# Patient Record
Sex: Male | Born: 1945
Health system: Southern US, Community
[De-identification: ages and names within clinical notes are randomized; demographics above are authoritative.]

## PROBLEM LIST (undated history)

## (undated) DIAGNOSIS — F1910 Other psychoactive substance abuse, uncomplicated: Secondary | ICD-10-CM

## (undated) DIAGNOSIS — J449 Chronic obstructive pulmonary disease, unspecified: Secondary | ICD-10-CM

## (undated) DIAGNOSIS — Z87442 Personal history of urinary calculi: Secondary | ICD-10-CM

## (undated) DIAGNOSIS — N200 Calculus of kidney: Secondary | ICD-10-CM

## (undated) DIAGNOSIS — R768 Other specified abnormal immunological findings in serum: Secondary | ICD-10-CM

## (undated) DIAGNOSIS — R7611 Nonspecific reaction to tuberculin skin test without active tuberculosis: Secondary | ICD-10-CM

## (undated) DIAGNOSIS — M1990 Unspecified osteoarthritis, unspecified site: Secondary | ICD-10-CM

## (undated) DIAGNOSIS — I10 Essential (primary) hypertension: Secondary | ICD-10-CM

## (undated) DIAGNOSIS — K759 Inflammatory liver disease, unspecified: Secondary | ICD-10-CM

## (undated) DIAGNOSIS — M5430 Sciatica, unspecified side: Secondary | ICD-10-CM

## (undated) HISTORY — DX: Other psychoactive substance abuse, uncomplicated: F19.10

## (undated) HISTORY — DX: Unspecified osteoarthritis, unspecified site: M19.90

## (undated) HISTORY — DX: Chronic obstructive pulmonary disease, unspecified: J44.9

## (undated) HISTORY — DX: Sciatica, unspecified side: M54.30

## (undated) HISTORY — PX: OTHER SURGICAL HISTORY: SHX169

## (undated) HISTORY — DX: Calculus of kidney: N20.0

## (undated) HISTORY — PX: ORTHOPEDIC SURGERY: SHX850

## (undated) HISTORY — PX: TONSILLECTOMY: SUR1361

---

## 2004-03-10 ENCOUNTER — Emergency Department (HOSPITAL_COMMUNITY): Admission: EM | Admit: 2004-03-10 | Discharge: 2004-03-10 | Payer: Self-pay | Admitting: Emergency Medicine

## 2011-07-06 ENCOUNTER — Emergency Department (INDEPENDENT_AMBULATORY_CARE_PROVIDER_SITE_OTHER)
Admission: EM | Admit: 2011-07-06 | Discharge: 2011-07-06 | Disposition: A | Discharge status: Home / Self Care | Payer: Self-pay | Source: Home / Self Care

## 2011-07-06 ENCOUNTER — Encounter: Payer: Self-pay | Admitting: *Deleted

## 2011-07-06 DIAGNOSIS — J45909 Unspecified asthma, uncomplicated: Secondary | ICD-10-CM

## 2011-07-06 DIAGNOSIS — I10 Essential (primary) hypertension: Secondary | ICD-10-CM

## 2011-07-06 HISTORY — DX: Other specified abnormal immunological findings in serum: R76.8

## 2011-07-06 HISTORY — DX: Essential (primary) hypertension: I10

## 2011-07-06 MED ORDER — LISINOPRIL-HYDROCHLOROTHIAZIDE 20-12.5 MG PO TABS: 1.0000 | ORAL_TABLET | Freq: Every day | ORAL | Status: DC

## 2011-07-06 MED ORDER — ALBUTEROL SULFATE HFA 108 (90 BASE) MCG/ACT IN AERS
INHALATION_SPRAY | RESPIRATORY_TRACT | Status: AC
  Filled 2011-07-06: qty 6.7

## 2011-07-06 MED ORDER — ALBUTEROL SULFATE HFA 108 (90 BASE) MCG/ACT IN AERS
2.0000 | INHALATION_SPRAY | RESPIRATORY_TRACT | Status: DC | PRN
  Administered 2011-07-06: 2 via RESPIRATORY_TRACT

## 2011-07-06 NOTE — ED Provider Notes (Signed)
History     CSN: [id number] Arrival date & time: 07/06/2011  3:40 PM   None     Chief Complaint  Patient presents with  . Medication Refill  . Requesting labs be done     (Consider location/radiation/quality/duration/timing/severity/associated sxs/prior treatment) HPI Comments: Pt presents requesting a refill of his BP medication and asthma inhaler. He was incarcerated and released from prison one month ago. His BP has been well controlled on his current BP medication. No chest pain or pressure. Also has a hx of asthma - well controlled. He states recently he has had some dyspnea in the evenings and is relieved with albuterol use. He is walking during the day without dyspnea or chest discomfort. No peripheral edema or orthopnea. He also would like to have PSA, Hep C and routine labs done. He had a positive Hep C test 4 yrs ago but was not followed up and was reportedly told by one of the physicians while incarcerated to have the blood test repeated in case it was a false positive. He also had an elevated PSA test while incarcerated but reports his levels then decreased. No prostate [place] or biopsy was done.   He has information to apply for his orange card and is working with someone for assistance in obtaining his orange card and a PCP - he has an appt on Monday regarding this.  Patient is a 65 y.o. male presenting with hypertension. The history is provided by the patient.  Hypertension This is a chronic problem. Episode onset: few yrs ago. The problem occurs constantly. The problem has not changed since onset.Associated symptoms include shortness of breath. Pertinent negatives include no chest pain, no abdominal pain and no headaches. The symptoms are aggravated by nothing. The symptoms are relieved by medications. Treatments tried: BP medication. The treatment provided significant relief.    Past Medical History  Diagnosis Date  . Asthma   . Hypertension   . Hepatitis C antibody test  positive     Past Surgical History  Procedure Date  . Orthopedic surgery     LEFT wrist in 1989    Family History  Problem Relation Age of Onset  . Cancer Other   . Heart disease Other     History  Substance Use Topics  . Smoking status: Former Smoker    Types: Cigarettes  . Smokeless tobacco: Not on file  . Alcohol Use: Yes     Social Use      Review of Systems  Constitutional: Negative for fever and chills.  Respiratory: Positive for cough and shortness of breath. Negative for wheezing.   Cardiovascular: Negative for chest pain, palpitations and leg swelling.  Gastrointestinal: Negative for nausea, vomiting and abdominal pain.  Neurological: Negative for headaches.    Allergies  Aspirin and Penicillins  Home Medications   Current Outpatient Rx  Name Route Sig Dispense Refill  . ALBUTEROL SULFATE HFA 108 (90 BASE) MCG/ACT IN AERS Inhalation Inhale 2 puffs into the lungs every 6 (six) hours as needed.      Marland Kitchen LISINOPRIL-HYDROCHLOROTHIAZIDE 20-12.5 MG PO TABS Oral Take 1 tablet by mouth daily. 30 tablet 0    BP 130/73  Pulse 60  Temp(Src) 97.8 F (36.6 C) (Oral)  Resp 20  SpO2 99%  Physical Exam  Nursing note and vitals reviewed. Constitutional: He appears well-developed and well-nourished. No distress.  HENT:  Head: Normocephalic and atraumatic.  Right Ear: Tympanic membrane, external ear and ear canal normal.  Left Ear: Tympanic  membrane, external ear and ear canal normal.  Nose: Nose normal.  Mouth/Throat: Uvula is midline, oropharynx is clear and moist and mucous membranes are normal. No oropharyngeal exudate, posterior oropharyngeal edema or posterior oropharyngeal erythema.  Eyes: Lids are normal. No scleral icterus.  Neck: Neck supple.  Cardiovascular: Normal rate, regular rhythm and normal heart sounds.   Pulmonary/Chest: Effort normal and breath sounds normal. No respiratory distress.  Musculoskeletal: He exhibits no edema.  Lymphadenopathy:     He has no cervical adenopathy.  Neurological: He is alert.  Skin: Skin is warm and dry.  Psychiatric: He has a normal mood and affect.    ED Course  Procedures (including critical care time)  Labs Reviewed - No data to display No results found.   1. Hypertension   2. Asthma       MDM  Discussed with pt that he will need to obtain a PCP for follow up of his chronic health concerns, including follow up of Hep C and PSA.       Melody Comas, PA 07/06/11 1700

## 2011-07-06 NOTE — ED Provider Notes (Signed)
Medical screening examination/treatment/procedure(s) were performed by non-physician practitioner and as supervising physician I was immediately available for consultation/collaboration.  Luiz Blare MD   Luiz Blare, MD 07/06/11 2229

## 2011-07-06 NOTE — ED Notes (Signed)
Pt here requesting medication refill for Lisinopril-HCTZ & Albuterol; blood draw for PSA & Hep C. Pt recently out of incarceration; has no PCP.

## 2011-09-03 ENCOUNTER — Encounter: Payer: Self-pay | Admitting: Family Medicine

## 2011-09-03 ENCOUNTER — Ambulatory Visit (INDEPENDENT_AMBULATORY_CARE_PROVIDER_SITE_OTHER): Payer: Self-pay | Admitting: Family Medicine

## 2011-09-03 DIAGNOSIS — R894 Abnormal immunological findings in specimens from other organs, systems and tissues: Secondary | ICD-10-CM

## 2011-09-03 DIAGNOSIS — M79609 Pain in unspecified limb: Secondary | ICD-10-CM

## 2011-09-03 DIAGNOSIS — R768 Other specified abnormal immunological findings in serum: Secondary | ICD-10-CM

## 2011-09-03 DIAGNOSIS — G8929 Other chronic pain: Secondary | ICD-10-CM

## 2011-09-03 DIAGNOSIS — M25569 Pain in unspecified knee: Secondary | ICD-10-CM

## 2011-09-03 DIAGNOSIS — R972 Elevated prostate specific antigen [PSA]: Secondary | ICD-10-CM | POA: Insufficient documentation

## 2011-09-03 DIAGNOSIS — N486 Induration penis plastica: Secondary | ICD-10-CM | POA: Insufficient documentation

## 2011-09-03 DIAGNOSIS — M79641 Pain in right hand: Secondary | ICD-10-CM

## 2011-09-03 DIAGNOSIS — L821 Other seborrheic keratosis: Secondary | ICD-10-CM

## 2011-09-03 DIAGNOSIS — R7611 Nonspecific reaction to tuberculin skin test without active tuberculosis: Secondary | ICD-10-CM

## 2011-09-03 DIAGNOSIS — I10 Essential (primary) hypertension: Secondary | ICD-10-CM

## 2011-09-03 DIAGNOSIS — K625 Hemorrhage of anus and rectum: Secondary | ICD-10-CM

## 2011-09-03 DIAGNOSIS — Z299 Encounter for prophylactic measures, unspecified: Secondary | ICD-10-CM

## 2011-09-03 LAB — HEPATITIS C ANTIBODY: HCV Ab: REACTIVE — AB

## 2011-09-03 LAB — COMPREHENSIVE METABOLIC PANEL
ALT: 24 U/L (ref 0–53)
AST: 26 U/L (ref 0–37)
Albumin: 4 g/dL (ref 3.5–5.2)
CO2: 26 mEq/L (ref 19–32)
Calcium: 9.2 mg/dL (ref 8.4–10.5)
Total Protein: 6.6 g/dL (ref 6.0–8.3)

## 2011-09-03 MED ORDER — LISINOPRIL-HYDROCHLOROTHIAZIDE 20-12.5 MG PO TABS: 1.0000 | ORAL_TABLET | Freq: Every day | ORAL | Status: DC

## 2011-09-03 NOTE — Patient Instructions (Signed)
It was great to meet you today. We will do the referrals for urology. We will make an appt for the procedure clinic at the [facility] for the moles on your back. You do not have tuberculosis - your symptom review is negative.  If your housing situation requires a chest x ray, then please call us and we will order that.   Please come back and see me in 1 month.  We will work on your wrist pain and knee pain at that time.

## 2011-09-03 NOTE — Assessment & Plan Note (Signed)
Will check hep C antibody test today, check cmp.

## 2011-09-03 NOTE — Assessment & Plan Note (Signed)
Pt at goal.  Refill meds today.  Check CMP.

## 2011-09-03 NOTE — Assessment & Plan Note (Signed)
Discussed at length the ramifications of checking PSA.  Pt prefers to go ahead with testing today.  Declines rectal exam.

## 2011-09-03 NOTE — Progress Notes (Signed)
Subjective:     Patient ID: Brian Morgan, male   DOB: 05/23/1946, 66 y.o.   MRN: [id number]  HPI 66 yo new pt here to establish care.  Has several issues: 1) Would like a PSA.  Has had PSA tests in the past, with highest being 5.9.  Last check was 3.15 April 2011.  Denies any trouble with initiating stream, incomplete voiding, other urinary sx except for frequency since starting his HCTZ.  Pt does not want a DRE today. 2) Has lots of moles on his back.  These scare his partner, and she would like to have them checked.  He has several that are large, and on his L side that he keeps catching with his arm. 3) Wants a CXR.  Has h/o positive PPD - took INH for 1 year in the 74s.  Denies cough, hemoptysis, weight loss and night sweats.  Thinks he needs the CXR for his living situation. 4) Would like a referral to urologist for Peyroni's disease.  Has been diagnosed with this about 8 months ago.  He has had no treatment other than observation.  When he has an erection, he notes a hard spot on his penis and curvature of the penis.  He has attempted intercourse, but is unsuccessful because "things don't fit".   5)Was told he had hep c when he tried to give blood years ago.  Would like another hep C test, because he was told it could be a false positive result.  He feels ok because all his tests since then (blood work) have been fine.  +h/o IVDU.  + tattoos 6)Would like a referral to have his partial plate on top fixed.  Currently glued together with super glue. 7) Has knee pain on L.  Was seen in past by ortho as part of voc rehab.  States at the time that he was told he should get 3 shots, but was unable to get that done because it was just for evaluation.  Pain present for many years.  Walks a lot as part of his day to day routine.   8) Carpal tunnel syndrome R wrist.  Has had pain in that hand. Received cortisone shots in 2000 and 2001.   9)Hemorrhoids- never had a colonoscopy.  Reports he has smears of  blood with bowel movements sometimes.  +itching.  Regular BM.  No constipation. 10)Needs BP pills refilled.  Has one left.  Has been stable on his current dose.  No SOB or CP.   Review of SystemsSee HPI.  Also with some sinus congestion "dripping".  No nausea or vomiting.     Objective:   Physical Exam  Constitutional: He appears well-developed and well-nourished. No distress.  HENT:  Head: Normocephalic and atraumatic.  Left Ear: External ear normal.  Nose: Nose normal.  Mouth/Throat: Oropharynx is clear and moist. No oropharyngeal exudate.       Plate on top is cracked and repaired.   R TM obscured by cerumen.  Eyes: Conjunctivae and EOM are normal. Pupils are equal, round, and reactive to light. Right eye exhibits no discharge. Left eye exhibits no discharge. No scleral icterus.  Neck: Normal range of motion. Neck supple. No tracheal deviation present. No thyromegaly present.  Cardiovascular: Normal rate, regular rhythm and normal heart sounds.  Exam reveals no gallop and no friction rub.   No murmur heard. Pulmonary/Chest: Effort normal and breath sounds normal. No stridor. No respiratory distress. He has no wheezes. He has no rales.  Abdominal: Soft. Bowel sounds are normal. He exhibits no distension and no mass. There is no tenderness. There is no rebound and no guarding.  Genitourinary:       Pt declined rectal exam.  GU exam deferred as pt wants urology referral.    Lymphadenopathy:    He has no cervical adenopathy.  Skin: Skin is warm and dry. He is not diaphoretic. No erythema.       Multiple seborrheic keratoses on trunk  Psychiatric:       Nl grooming and dress.  Affect normal.  Thought content and process normal, however, does make jokes to partner about using her scarf to strangle her.  No FOI, No LOA.  Non labile.        Assessment:     See problem list    Plan:        See problem list.

## 2011-09-03 NOTE — Assessment & Plan Note (Signed)
Pt definitely wants referral to urology.  Exam deferred today.

## 2011-09-04 DIAGNOSIS — M79641 Pain in right hand: Secondary | ICD-10-CM | POA: Insufficient documentation

## 2011-09-04 DIAGNOSIS — L821 Other seborrheic keratosis: Secondary | ICD-10-CM | POA: Insufficient documentation

## 2011-09-04 DIAGNOSIS — M25562 Pain in left knee: Secondary | ICD-10-CM | POA: Insufficient documentation

## 2011-09-04 DIAGNOSIS — Z299 Encounter for prophylactic measures, unspecified: Secondary | ICD-10-CM | POA: Insufficient documentation

## 2011-09-04 DIAGNOSIS — K625 Hemorrhage of anus and rectum: Secondary | ICD-10-CM | POA: Insufficient documentation

## 2011-09-04 DIAGNOSIS — R7611 Nonspecific reaction to tuberculin skin test without active tuberculosis: Secondary | ICD-10-CM | POA: Insufficient documentation

## 2011-09-04 NOTE — Assessment & Plan Note (Signed)
Patient reports bright red blood per rectum. He denies ever having a colonoscopy. He does report a history of hemorrhoids. However given the symptom a colonoscopy would be warranted, but finances we'll make this difficult. We will need to refer to [facility] for colonoscopy.

## 2011-09-04 NOTE — Assessment & Plan Note (Signed)
Symptom review negative today no need for chest x-ray however if his housing requires a chest x-ray I will be happy to order one.

## 2011-09-04 NOTE — Assessment & Plan Note (Signed)
Patient would like to see Dr. Denny Levy in  sports medicine clinic because his ex-wife has seen her as well. Notes reviewed from Saundra Shelling assessment. It appears the patient has DJD in his left knee. Will defer treatment to sports medicine clinic.

## 2011-09-04 NOTE — Assessment & Plan Note (Signed)
Patient with multiple seborrheic keratoses on his back and chest. No lesions concerning for malignancy. Will refer to procedure clinic for removal of the ones that are bothering him.

## 2011-09-04 NOTE — Assessment & Plan Note (Signed)
Deferred today due to time constraints will address next visit.

## 2011-09-04 NOTE — Progress Notes (Signed)
Addended by: [place], SARAH T on: 09/04/2011 09:55 AM   Modules accepted: Orders

## 2011-09-05 ENCOUNTER — Encounter: Payer: Self-pay | Admitting: Family Medicine

## 2011-09-05 ENCOUNTER — Ambulatory Visit (INDEPENDENT_AMBULATORY_CARE_PROVIDER_SITE_OTHER): Payer: Self-pay | Admitting: Family Medicine

## 2011-09-05 VITALS — BP 146/81 | HR 60 | Ht 72.0 in | Wt 176.0 lb

## 2011-09-05 DIAGNOSIS — M1990 Unspecified osteoarthritis, unspecified site: Secondary | ICD-10-CM | POA: Insufficient documentation

## 2011-09-05 MED ORDER — METHYLPREDNISOLONE ACETATE 80 MG/ML IJ SUSP: 80.0000 mg | Freq: Once | INTRAMUSCULAR | Status: DC

## 2011-09-05 NOTE — Assessment & Plan Note (Signed)
We discussed stages of treatment to include bracing which is has a custom hinge brace (but it is in storage).  We started with a steroid injection today.  I encouraged him to get his brace out of storage.  At the moment he is wearing a knee sleeve which he says helps.  If the steroid injection gives him relief we will plan on repeating PRN. He is allergic to ASA so he cannot take NSAIDs.  If he doesn't get relief with the steroid injection we will discuss viscous supplementation.  He is not planning on applying for medicare until age 66 so TKR is not an option at this time.

## 2011-09-05 NOTE — Patient Instructions (Addendum)
Your knee pain is from severe arthritis.  Today we did an injection.  Today we did a steroid injection into the knee.  This should also help with inflammation and pain.  You may have some crampy pain of the knee tonight, just put ice on it.  This is a common thing to happen on the first night.  Your knee pain should improve over the next few days.  If you continue to have pain you can try taking Tylenol.   We also discussed your carpal tunnel syndrome.  We will plan to do an injection for that on your follow up visit.  In the mean time I recommend that you wear your wrist brace and avoid activities that worsen your symptoms.  Please follow up with me in 3-4 weeks.  The phone number to the office is [phone].  Glo Herring, MD

## 2011-09-05 NOTE — Progress Notes (Signed)
  Subjective:    Patient ID: Brian Morgan, male    DOB: [birth year], 66 y.o.   MRN: [id number]  HPI 66 y/o male is here for chronic knee pain secondary severe degenerative arthritis.  He has a history of being stepped on by a horse in 1984.  He was evaluated by Dr. Dion Saucier at [facility] in December 2012 and diagnosed with end stage DJD.  The evaluation indicated that he would eventually need a TKR.  He didn't get any treatment at the time of the evaluation because he couldn't afford to pay for injection therapy.  He is here today after having obtained an orange card.   He also has a history of right carpal tunnel syndrome dating back to 1995.  Back in 2001 he got a steroid injection and it improved his symptoms for over one year.  He wears a brace now at night and sometimes in the day but is still bothered by symptoms.  He is wondering if he can get an injection.   Review of Systems     Objective:   Physical Exam  No effusion of either knee Both knees have crepitus, left greater than right ROM is 0-110 He has pain with flexion of the knee There is instability with valgus stress Positive anterior drawer   The right wrist has no swelling No tenderness to palpation and a normal range of motion Positive Tinel Thenar imminence bulk is preserved Sensation is normal in the fingers and hand Strength is normal in the fingers and hand   Consent obtained and verified. Cleansed with alcohol. Topical analgesic spray: Ethyl chloride. Joint: left knee Approached in typical fashion with: Completed without difficulty Meds:80 mg depo medrol; 4ml of lidocaine without epinephrine Needle: 25 g Aftercare instructions and Red flags advised.      Assessment & Plan:

## 2011-09-06 ENCOUNTER — Other Ambulatory Visit: Payer: Self-pay | Admitting: Family Medicine

## 2011-09-06 ENCOUNTER — Telehealth: Payer: Self-pay | Admitting: Family Medicine

## 2011-09-06 DIAGNOSIS — R768 Other specified abnormal immunological findings in serum: Secondary | ICD-10-CM

## 2011-09-06 NOTE — Telephone Encounter (Signed)
Called pt to discuss his hep c results.  Left message to call me.

## 2011-09-07 ENCOUNTER — Other Ambulatory Visit: Payer: Self-pay

## 2011-09-07 DIAGNOSIS — R768 Other specified abnormal immunological findings in serum: Secondary | ICD-10-CM

## 2011-09-12 LAB — HEPATITIS C RNA QUANTITATIVE: HCV Quantitative Log: 6.14 {Log} — ABNORMAL HIGH (ref <1.63)

## 2011-09-17 ENCOUNTER — Other Ambulatory Visit: Payer: Self-pay | Admitting: Family Medicine

## 2011-09-17 DIAGNOSIS — B1920 Unspecified viral hepatitis C without hepatic coma: Secondary | ICD-10-CM

## 2011-09-26 ENCOUNTER — Encounter: Payer: Self-pay | Admitting: Family Medicine

## 2011-09-26 ENCOUNTER — Ambulatory Visit (INDEPENDENT_AMBULATORY_CARE_PROVIDER_SITE_OTHER): Payer: Self-pay | Admitting: Family Medicine

## 2011-09-26 VITALS — BP 147/85 | HR 61 | Ht 72.0 in | Wt 185.0 lb

## 2011-09-26 DIAGNOSIS — G5603 Carpal tunnel syndrome, bilateral upper limbs: Secondary | ICD-10-CM

## 2011-09-26 DIAGNOSIS — G5600 Carpal tunnel syndrome, unspecified upper limb: Secondary | ICD-10-CM

## 2011-09-26 NOTE — Patient Instructions (Signed)
We injected your hand today for carpal tunnel.  Follow up with me if your hands or knee starts to bother you again.

## 2011-09-28 ENCOUNTER — Other Ambulatory Visit: Payer: Self-pay | Admitting: Family Medicine

## 2011-09-28 ENCOUNTER — Telehealth: Payer: Self-pay | Admitting: Family Medicine

## 2011-09-28 DIAGNOSIS — R7611 Nonspecific reaction to tuberculin skin test without active tuberculosis: Secondary | ICD-10-CM

## 2011-09-28 NOTE — Telephone Encounter (Signed)
Dr. Swaziland, Can you please put in chest x-ray order ----[place] Dec

## 2011-09-28 NOTE — Telephone Encounter (Signed)
Pt is needing to get a chest xray due to him not being able to get a PPD.  The facility he is staying at is requiring this. [facility] P. [phone] F. [phone]

## 2011-10-01 ENCOUNTER — Telehealth: Payer: Self-pay | Admitting: *Deleted

## 2011-10-01 ENCOUNTER — Ambulatory Visit (HOSPITAL_COMMUNITY)
Admission: RE | Admit: 2011-10-01 | Discharge: 2011-10-01 | Disposition: A | Discharge status: Home / Self Care | Payer: Self-pay | Source: Ambulatory Visit | Attending: Family Medicine | Admitting: Family Medicine

## 2011-10-01 ENCOUNTER — Telehealth: Payer: Self-pay | Admitting: Family Medicine

## 2011-10-01 DIAGNOSIS — R7611 Nonspecific reaction to tuberculin skin test without active tuberculosis: Secondary | ICD-10-CM | POA: Insufficient documentation

## 2011-10-01 NOTE — Telephone Encounter (Signed)
[facility] form-Consult sheet for chest x-ray placed in Dr. Elvis Coil box for signature.  Ileana Ladd

## 2011-10-01 NOTE — Telephone Encounter (Signed)
Patient dropped off form to be filled out for confirmation of xray.  Please fax when completed.

## 2011-10-01 NOTE — Telephone Encounter (Signed)
Called and informed pt that Dr. Swaziland would like for him to have a CXR done and I told pt that he can go over to [facility] 1st floor radiology to have this done and his convenience. Pt understood and agreed.Laureen Ochs, Viann Shove

## 2011-10-02 NOTE — Telephone Encounter (Signed)
There [facility] form completed and faxed with copy of chest x-ray to [phone].  Ileana Ladd

## 2011-10-03 DIAGNOSIS — G5603 Carpal tunnel syndrome, bilateral upper limbs: Secondary | ICD-10-CM | POA: Insufficient documentation

## 2011-10-03 MED ORDER — METHYLPREDNISOLONE ACETATE 40 MG/ML IJ SUSP: 40.0000 mg | Freq: Once | INTRAMUSCULAR | Status: DC

## 2011-10-03 NOTE — Assessment & Plan Note (Signed)
Injected right carpal tunnel.  Follow up PRN

## 2011-10-03 NOTE — Progress Notes (Signed)
  Subjective:    Patient ID: Brian Morgan, male    DOB: [birth year], 66 y.o.   MRN: [id number]  HPI 66 y/o male is here with known right carpal tunnel.  He had an injection in the past that helped and he is here today for another injection.   Review of Systems     Objective:   Physical Exam  Right hand: Sensation is intact Normal motion at the hand and the wrist Thenar imminence unremarkable positive tinel's   Consent obtained and verified. Cleansed with alcohol. Topical analgesic spray: Ethyl chloride. Joint: carpal tunnel Approached in typical fashion Completed without difficulty Meds: 40mg  depo medrol; 1 ml of lidocaine without epinephrine Needle: 25g Aftercare instructions and Red flags advised.      Assessment & Plan:

## 2011-10-08 ENCOUNTER — Ambulatory Visit (INDEPENDENT_AMBULATORY_CARE_PROVIDER_SITE_OTHER): Payer: Self-pay | Admitting: Family Medicine

## 2011-10-08 VITALS — BP 122/72 | HR 74 | Temp 97.6°F | Ht 72.0 in | Wt 174.0 lb

## 2011-10-08 DIAGNOSIS — K625 Hemorrhage of anus and rectum: Secondary | ICD-10-CM

## 2011-10-08 DIAGNOSIS — L821 Other seborrheic keratosis: Secondary | ICD-10-CM

## 2011-10-08 DIAGNOSIS — N486 Induration penis plastica: Secondary | ICD-10-CM

## 2011-10-08 DIAGNOSIS — I10 Essential (primary) hypertension: Secondary | ICD-10-CM

## 2011-10-08 DIAGNOSIS — B1920 Unspecified viral hepatitis C without hepatic coma: Secondary | ICD-10-CM | POA: Insufficient documentation

## 2011-10-08 DIAGNOSIS — Z23 Encounter for immunization: Secondary | ICD-10-CM

## 2011-10-08 NOTE — Assessment & Plan Note (Signed)
Will schedule in procedure clinic for removal.

## 2011-10-08 NOTE — Assessment & Plan Note (Signed)
Pt has been referrred to Hep C clinic.  He is not interested in treatment at this time, but agrees to go and discuss options.

## 2011-10-08 NOTE — Patient Instructions (Signed)
IT was good to see you today. We will check into the referrals for urology. Please let us know if you want to see the Hep C doctors, or if you can get to [facility] for a colonoscopy. We will set you up in the procedure clinic for the moles.

## 2011-10-08 NOTE — Assessment & Plan Note (Signed)
Still awaiting urology.  Considering going to [facility].

## 2011-10-08 NOTE — Assessment & Plan Note (Signed)
Will refer to [facility] for colonoscopy.  Pt with limited financial resources, but interested in an appt at [facility].

## 2011-10-08 NOTE — Progress Notes (Signed)
  Subjective:    Patient ID: Brian Morgan, male    DOB: 07-Sep-1945, 66 y.o.   MRN: [id number]  HPI 65yo to follow up on Hep C, urology referrals and mole removal. He has not heard from the urology referral, and would very much like to hear from them. Hep C- He does not want any treatment, has a friend currently taking Interferon and does not want that.  He feels fine, and reports his labs have always been fine.  Agrees to get Hep A nd B vaccines.  Knows to avoid alcohol and acetaminophen.  Agrees to go to Hep C clinic and hear options. SK- was to be scheduled for procedure clinic for this. Rectal bleeding - unable to afford colonoscopy.  Having difficulties getting to [facility], but thinks it might be possible.  Long h/o hemorrhoids, notes bleeding when he wipes.  Uses witch hazel wipes and hemorrhoid ointment. Has never had colonoscopy. Seen by sports medicine clinic.  States wrist injection helped somewhat, but still has pain.  Knee injection did not help.      Review of Systems See hpi     Objective:   Physical Exam  Constitutional: He appears well-developed and well-nourished. No distress.  Skin: He is not diaphoretic.          Assessment & Plan:

## 2011-10-08 NOTE — Assessment & Plan Note (Signed)
Repeat BP normal.  Continue current regimen.

## 2011-10-11 ENCOUNTER — Telehealth: Payer: Self-pay | Admitting: Family Medicine

## 2011-10-11 NOTE — Telephone Encounter (Signed)
Is asking about referrals Urology & GI to [facility]  Best number - [phone]

## 2011-10-18 NOTE — Telephone Encounter (Signed)
GI referral is the referral that was made to hepatitis clinic. GI referred stated he needed to go to that. Appointment is for 02/21/2012 @ 11:30 am

## 2011-10-18 NOTE — Telephone Encounter (Signed)
Brian Morgan is working on.

## 2011-10-22 NOTE — Telephone Encounter (Signed)
Patient is calling about the different referrals that Dr. Swaziland set in place.  He hasn't heard anything about the one for removing moles, Urology and he was told about the one for GI in July, but he needs to know where it is at.

## 2011-10-24 NOTE — Telephone Encounter (Signed)
Pt stll waiting for information about appts with GI and Urology practices.  Please call him as soon as appts made.

## 2011-10-25 NOTE — Telephone Encounter (Signed)
Mole removal set up for 4/4 @ 11am with colpo clinic. Spoke with Dr. Jennette Kettle about setting this up and go OK to set appointment

## 2011-10-25 NOTE — Telephone Encounter (Signed)
Informed patient of that GI appointment at the Hep C clinic. Patient had stated that he was to be seen at the procedure clinic to have the moles removed. Will check on how and when we will schedule this appointment for him.

## 2011-11-08 ENCOUNTER — Ambulatory Visit (INDEPENDENT_AMBULATORY_CARE_PROVIDER_SITE_OTHER): Payer: Self-pay | Admitting: Family Medicine

## 2011-11-08 VITALS — BP 110/72 | HR 62 | Temp 97.8°F | Ht 72.0 in | Wt 169.0 lb

## 2011-11-08 DIAGNOSIS — L821 Other seborrheic keratosis: Secondary | ICD-10-CM

## 2011-11-08 NOTE — Patient Instructions (Signed)
Keep those areas where we removed the lesions clean.  Use antibiotic ointment and keep covered for the next 1-2 days.   If you develop signs of infection (fever/chills, redness, tenderness) in that area, please make an appointment to have those areas evaluated.

## 2011-11-08 NOTE — Progress Notes (Signed)
Patient ID: Brian Morgan, male   DOB: [birth year], 66 y.o.   MRN: [id number] Patient is here for evaluation and management of multiple lesions on his torso. He has had these many years. They are enlarging. Several of them are quite irritated by his clothing etc. He has previously had some of them taken off and was told they were benign. SUBJECTIVE: Multiple hyperpigmented thickened skin lesions consistent with seborrheic keratoses. 3 of these are irritated looking to under the left arm area and one under the right arm area. All of these are oblong and between one half and 1 cm in PROCEDURE note: Patient gives informed consent signed copy in the chart. Area cleaned with Betadine and alcohol and local anesthesia was obtained with 2% lidocaine with epinephrine. The seborrheic keratoses were curetted off. There is minimal bleeding. We applied Band-Aids. He will follow up with his regular provider. He has many other seborrheic keratoses on his torso. Unless they become irritated or inflamed at all because he may need to remove them. They're very classic seborrheic keratosis and I don't think any of them at this time the biopsy.

## 2011-11-23 ENCOUNTER — Other Ambulatory Visit: Payer: Self-pay | Admitting: *Deleted

## 2011-12-03 ENCOUNTER — Encounter: Payer: Self-pay | Admitting: Family Medicine

## 2011-12-06 ENCOUNTER — Ambulatory Visit (INDEPENDENT_AMBULATORY_CARE_PROVIDER_SITE_OTHER): Payer: Self-pay | Admitting: Family Medicine

## 2011-12-06 ENCOUNTER — Encounter: Payer: Self-pay | Admitting: Family Medicine

## 2011-12-06 DIAGNOSIS — G8929 Other chronic pain: Secondary | ICD-10-CM

## 2011-12-06 DIAGNOSIS — M25569 Pain in unspecified knee: Secondary | ICD-10-CM

## 2011-12-06 NOTE — Assessment & Plan Note (Signed)
After verbal and written consent given pt was prepped with betadine.  1:3 kenalog 40 to lidocaine used in left knee.  Pt minimal bleeding dressed with band aid, Pt given red flags to look for pt had better pain control immediatly.   Encourage him to followup with [facility] when he has funds available. Followup with private care physician as needed.

## 2011-12-06 NOTE — Progress Notes (Signed)
  Subjective:    Patient ID: Brian Morgan, male    DOB: [birth year], 66 y.o.   MRN: [id number]  HPI SUBJECTIVE: Brian Morgan is a 66 y.o. male who sustained a left knee pain. Patient has had a history of significant arthritis in that knee. Patient has been seen by sports medicine as well as his primary care physician for this pain multiple times. Patient had an injection with appears to be approximately 10 weeks ago. Pain did seem to improve but still having significant pain now. Patient is supposed to go to [facility] to be seen but they $100 upfront which he does not have at this time. Patient states that he needs to walk with a cane sometimes feels that the knee is giving out.  OBJECTIVE: Vital signs as noted above. Appearance: alert, well appearing, and in no distress. Knee exam: exam limited by acuity of pain, negative drawer sign, significant osteo-arthritis changes.. X-ray: not indicated.  ASSESSMENT: Knee osteoarthritis  PLAN: Procedure note After verbal and written consent given pt was prepped with betadine.  1:3 kenalog 40 to lidocaine used in left knee.  Pt minimal bleeding dressed with band aid, Pt given red flags to look for pt had better pain control immediatly.    Review of Systems     Objective:   Physical Exam        Assessment & Plan:

## 2011-12-10 ENCOUNTER — Encounter: Payer: Self-pay | Admitting: Family Medicine

## 2011-12-10 ENCOUNTER — Ambulatory Visit (INDEPENDENT_AMBULATORY_CARE_PROVIDER_SITE_OTHER): Payer: Self-pay | Admitting: Family Medicine

## 2011-12-10 VITALS — BP 127/80 | HR 65

## 2011-12-10 DIAGNOSIS — M25569 Pain in unspecified knee: Secondary | ICD-10-CM

## 2011-12-10 DIAGNOSIS — M1990 Unspecified osteoarthritis, unspecified site: Secondary | ICD-10-CM

## 2011-12-11 ENCOUNTER — Encounter: Payer: Self-pay | Admitting: Family Medicine

## 2011-12-11 NOTE — Progress Notes (Signed)
  Subjective:    Patient ID: Brian Morgan, male    DOB: Apr 02, 1946, 66 y.o.   MRN: [id number]  HPI  Chronic left knee pain that has worsened over the last several weeks. Had a corticosteroid injection in about 3-1/2 months ago that was very useful. His pain went from a 1010 to a 4-5 at 10 on a daily basis. He had a second injection last week by Dr. Katrinka Blazing. They injection has not seemed to help at all. He's had no relief of symptoms. His pain right now is 10 out of 10 all day. He has a lot of pain with walking. He noted no specific swelling, erythema, warmth. The knee feels like it's going to catch occasionally but has not given way. He's had no calf pain. Knee pain does seem to radiate upward into his thigh and left hip. He really wants a second corticosteroid shot area  Review of Systems    denies fever, sweats, chills. Objective:   Physical Exam  GENERAL: Well-developed male no acute distress vital signs are reviewed KNEES: Bilaterally he has full range of motion in extension and flexion. The left knee has no swelling, no erythema, no effusion. Positive crepitus on extension. Medial joint line tenderness. No lateral joint line tenderness. Negative McMurray. Normal Lachman. Ligamentously intact to varus and valgus stress. Calf is soft. Left hip nontender to palpation. Internal and external rotation are full and non-painful.  INJECTION: Patient was given informed consent, signed copy in the chart. Appropriate time out was taken. Area prepped and draped in usual sterile fashion. One cc of methylprednisolone 40 mg/ml plus  4 cc of 1% lidocaine without epinephrine was injected into the left knee joint using a(n) anterior medial approach. The patient tolerated the procedure well. There were no complications. Post procedure instructions were given.       Assessment & Plan:  #1 left knee pain thought to be related to moderate osteoarthritis. He had great relief from his first knee injection but no  relief from his second. No real options for him other than to repeat his injection today so we did that. If this does not work then I do not think further corticosteroid injections would be useful. He will let me know howhe does.

## 2011-12-19 ENCOUNTER — Ambulatory Visit (INDEPENDENT_AMBULATORY_CARE_PROVIDER_SITE_OTHER): Payer: Self-pay | Admitting: Family Medicine

## 2011-12-19 VITALS — BP 128/74

## 2011-12-19 DIAGNOSIS — M5430 Sciatica, unspecified side: Secondary | ICD-10-CM

## 2011-12-19 DIAGNOSIS — M79609 Pain in unspecified limb: Secondary | ICD-10-CM

## 2011-12-19 DIAGNOSIS — M79606 Pain in leg, unspecified: Secondary | ICD-10-CM

## 2011-12-19 MED ORDER — PREDNISONE 10 MG PO KIT: PACK | ORAL | Status: DC

## 2011-12-20 ENCOUNTER — Telehealth: Payer: Self-pay | Admitting: *Deleted

## 2011-12-20 NOTE — Telephone Encounter (Signed)
[facility] called- states for prednisone 10 mg 12 day taper it should be quantity 48 instead of 21.  Per Dr. Laural Benes quantity changed to #48.

## 2011-12-27 ENCOUNTER — Ambulatory Visit (HOSPITAL_COMMUNITY)
Admission: RE | Admit: 2011-12-27 | Discharge: 2011-12-27 | Disposition: A | Discharge status: Home / Self Care | Payer: Self-pay | Source: Ambulatory Visit | Attending: Family Medicine | Admitting: Family Medicine

## 2011-12-27 DIAGNOSIS — M79609 Pain in unspecified limb: Secondary | ICD-10-CM | POA: Insufficient documentation

## 2011-12-27 DIAGNOSIS — M79606 Pain in leg, unspecified: Secondary | ICD-10-CM

## 2011-12-27 DIAGNOSIS — M5430 Sciatica, unspecified side: Secondary | ICD-10-CM | POA: Insufficient documentation

## 2012-01-01 DIAGNOSIS — M5430 Sciatica, unspecified side: Secondary | ICD-10-CM | POA: Insufficient documentation

## 2012-01-01 NOTE — Assessment & Plan Note (Signed)
Prednisone taper.  Plain films of the lumbar spine to evaluate the intervertebral spaces.  Follow up in 3-4 weeks or sooner if symptoms do not improve.

## 2012-01-01 NOTE — Progress Notes (Signed)
  Subjective:    Patient ID: Brian Morgan, male    DOB: Dec 11, 1945, 66 y.o.   MRN: [id number]  HPI 66 y/o male is c/o left leg pain.  The pain starts in the buttocks and radiates down the posterior leg.  He gets cramping in the calf and the posterior knee.  He feels like he has knots in the back of his leg with the cramping. He doesn't really have a great amount of back pain. No recent travel. No incontinence, saddle anesthesia, or weakness.     Review of Systems     Objective:   Physical Exam  Back non-tender to palpation Good ROM of the lumbar spine Negative straight leg raise No lower leg swelling Negative Homan's Mild tenderness to palpation of the hamstring and calf  Mild fullness of the posterior knee at the medial gastroc, c/w a baker's cyst Normal LE strength        Assessment & Plan:

## 2012-01-25 ENCOUNTER — Ambulatory Visit (INDEPENDENT_AMBULATORY_CARE_PROVIDER_SITE_OTHER): Payer: Self-pay | Admitting: Family Medicine

## 2012-01-25 VITALS — BP 107/73

## 2012-01-25 DIAGNOSIS — M5430 Sciatica, unspecified side: Secondary | ICD-10-CM

## 2012-01-25 MED ORDER — AMITRIPTYLINE HCL 25 MG PO TABS: 25.0000 mg | ORAL_TABLET | Freq: Every day | ORAL | Status: DC

## 2012-01-25 NOTE — Patient Instructions (Addendum)
Your pain is caused by irritation of the nerve in your back.  I am giving you a medication called amitriptyline that will treat nerve pain.  It causes drowsiness so take it at night.  If you have too much drowsiness or experience any of the other side effects listed in your handout stop taking the medication and give [place] a call.  Follow up with [place] in one month.

## 2012-02-01 ENCOUNTER — Encounter: Payer: Self-pay | Admitting: *Deleted

## 2012-02-01 NOTE — Assessment & Plan Note (Signed)
Starting amitriptyline for control of symptoms Gave patient education handout for this med.

## 2012-02-01 NOTE — Progress Notes (Signed)
  Subjective:    Patient ID: Brian Morgan, male    DOB: 15-Aug-1945, 66 y.o.   MRN: [id number]  HPI 66 y/o male is here to follow up for left sided lower extremity radicular pain.  The symptoms improved on a dose pack but have started to come back.  He has no weakness, incontinence or saddle anesthesia.     Review of Systems     Objective:   Physical Exam  Back is non-tender to palpation Negative straight leg raise ROM of the lumbar spine has returned to baseline DTR's 1+ bilaterally Sensation intact  Strength is normal  Lumbar films Mild to moderate multilevel degenerative changes, most prominent at  L5-S1.       Assessment & Plan:

## 2012-02-19 ENCOUNTER — Telehealth: Payer: Self-pay | Admitting: Family Medicine

## 2012-02-19 NOTE — Telephone Encounter (Signed)
Called and left message with pt's wife to inform pt that their clinic will call him back in regards to his appt.Laureen Ochs, Viann Shove

## 2012-02-19 NOTE — Telephone Encounter (Signed)
Is asking about him going to [facility] specialist - they had cancelled it and hasn't heard any more about this.

## 2012-02-21 ENCOUNTER — Ambulatory Visit: Payer: Self-pay | Admitting: Gastroenterology

## 2012-02-22 ENCOUNTER — Encounter: Payer: Self-pay | Admitting: Family Medicine

## 2012-02-22 ENCOUNTER — Ambulatory Visit (INDEPENDENT_AMBULATORY_CARE_PROVIDER_SITE_OTHER): Payer: Self-pay | Admitting: Family Medicine

## 2012-02-22 VITALS — BP 129/77 | HR 65

## 2012-02-22 DIAGNOSIS — G5603 Carpal tunnel syndrome, bilateral upper limbs: Secondary | ICD-10-CM

## 2012-02-22 DIAGNOSIS — G5600 Carpal tunnel syndrome, unspecified upper limb: Secondary | ICD-10-CM

## 2012-02-22 MED ORDER — MELOXICAM 15 MG PO TABS: 15.0000 mg | ORAL_TABLET | Freq: Every day | ORAL | Status: DC

## 2012-02-22 MED ORDER — GABAPENTIN 300 MG PO CAPS: 300.0000 mg | ORAL_CAPSULE | Freq: Every day | ORAL | Status: DC

## 2012-02-22 NOTE — Patient Instructions (Addendum)
Good to see you both today. I am injected your carpal tunnel syndrome again today. Try to do some the exercises that are giving you today. Mostly giving you some exercises for what is called puriform syndrome. This should help as well. I am printing you prescriptions for the Neurontin as well as meloxicam to try. This should help decrease the inflammation. He should come back in 6 weeks for reevaluation to make sure you're getting better.  Piriformis Syndrome with Rehab Piriformis syndrome is a condition the affects the nervous system in the area of the hip, and is characterized by pain and possibly a loss of feeling in the backside (posterior) thigh that may extend down the entire length of the leg. The symptoms are caused by an increase in pressure on the sciatic nerve by the piriformis muscle, which is on the back of the hip and is responsible for externally rotating the hip. The sciatic nerve and its branches connect to much of the leg. Normally the sciatic nerve runs between the piriformis muscle and other muscles. However, in certain individuals the nerve runs through the muscle, which causes an increase in pressure on the nerve and results in the symptoms of piriformis syndrome. SYMPTOMS   Pain, tingling, numbness, or burning in the back of the thigh that may also extend down the entire leg.   Occasionally, tenderness in the buttock.   Loss of function of the leg.   Pain that worsens when using the piriformis muscle (running, jumping, or stairs).   Pain that increases with prolonged sitting.   Pain that is lessened by laying flat on the back.  CAUSES   Piriformis syndrome is the result of an increase in pressure placed on the sciatic nerve. Often times piriformis syndrome is an overuse injury.   Stress placed on the nerve from a sudden increase in the intensity, frequency, or duration of training.   Compensation of other extremity injuries.  RISK INCREASES WITH:  Sports that  involve the piriformis muscle (running, walking or jumping).   You are born with (congenital) a defect in which the sciatic nerve passes through the muscle.  PREVENTION  Warm up and stretch properly before activity.   Allow for adequate recovery between workouts.   Maintain physical fitness:   Strength, flexibility, and endurance.   Cardiovascular fitness.  PROGNOSIS  If treated properly, then the symptoms of piriformis syndrome usually resolve in 2 to 6 weeks. RELATED COMPLICATIONS   Persistent and possibly permanent pain and numbness in the lower extremity.   Weakness of the extremity that may progress to disability and inability to compete.  TREATMENT  The most effective treatment for piriformis syndrome is rest from any activities that aggravate the symptoms. Ice and pain medication may help reduce pain and inflammation. The use of strengthening and stretching exercises may help reduce pain with activity. These exercises may be performed at home or with a therapist. A referral to a therapist may be given for further evaluation and treatment, such as ultrasound. Corticosteroid injections may be given to reduce inflammation that is causing pressure to be placed on the sciatic nerve. If non-surgical (conservative) treatment is unsuccessful, then surgery may be recommended.  MEDICATION   If pain medication is necessary, then nonsteroidal anti-inflammatory medications, such as aspirin and ibuprofen, or other minor pain relievers, such as acetaminophen, are often recommended.   Do not take pain medication for 7 days before surgery.   Prescription pain relievers may be given if deemed necessary by  your caregiver. Use only as directed and only as much as you need.   Corticosteroid injections may be given by your caregiver. These injections should be reserved for the most serious cases, because they may only be given a certain number of times.  HEAT AND COLD:   Cold treatment (icing)  relieves pain and reduces inflammation. Cold treatment should be applied for 10 to 15 minutes every 2 to 3 hours for inflammation and pain and immediately after any activity that aggravates your symptoms. Use ice packs or massage the area with a piece of ice (ice massage).   Heat treatment may be used prior to performing the stretching and strengthening activities prescribed by your caregiver, physical therapist, or athletic trainer. Use a heat pack or soak the injury in warm water.  SEEK IMMEDIATE MEDICAL CARE IF:  Treatment seems to offer no benefit, or the condition worsens.   Any medications produce adverse side effects.  EXERCISES RANGE OF MOTION (ROM) AND STRETCHING EXERCISES - Piriformis Syndrome These exercises may help you when beginning to rehabilitate your injury. Your symptoms may resolve with or without further involvement from your physician, physical therapist or athletic trainer. While completing these exercises, remember:   Restoring tissue flexibility helps normal motion to return to the joints. This allows healthier, less painful movement and activity.   An effective stretch should be held for at least 30 seconds.   A stretch should never be painful. You should only feel a gentle lengthening or release in the stretched tissue.  STRETCH - Hip Rotators  Lie on your back on a firm surface. Grasp your right / left knee with your right / left hand and your ankle with your opposite hand.   Keeping your hips and shoulders firmly planted, gently pull your right / left knee and rotate your lower leg toward your opposite shoulder until you feel a stretch in your buttocks.   Hold this stretch for __________ seconds.  Repeat this stretch __________ times. Complete this stretch __________ times per day. STRETCH - Iliotibial Band  On the floor or bed, lie on your side so your right / left leg is on top. Bend your knee and grab your ankle.   Slowly bring your knee back so that your  thigh is in line with your trunk. Keep your heel at your buttocks and gently arch your back so your head, shoulders and hips line up.   Slowly lower your leg so that your knee approaches the floor/bed until you feel a gentle stretch on the outside of your right / left thigh. If you do not feel a stretch and your knee will not fall farther, place the heel of your opposite foot on top of your knee and pull your thigh down farther.   Hold this stretch for __________ seconds.  Repeat __________ times. Complete __________ times per day. STRENGTHENING EXERCISES - Piriformis Syndrome  These are some of the caregiver again or until your symptoms are resolved. Remember:   Strong muscles with good endurance tolerate stress better.   Do the exercises as initially prescribed by your caregiver. Progress slowly with each exercise, gradually increasing the number of repetitions and weight used under their guidance.  STRENGTH - Hip Abductors, Straight Leg Raises Be aware of your form throughout the entire exercise so that you exercise the correct muscles. Sloppy form means that you are not strengthening the correct muscles.  Lie on your side so that your head, shoulders, knee and hip line up.  You may bend your lower knee to help maintain your balance. Your right / left leg should be on top.   Roll your hips slightly forward, so that your hips are stacked directly over each other and your right / left knee is facing forward.   Lift your top leg up 4-6 inches, leading with your heel. Be sure that your foot does not drift forward or that your knee does not roll toward the ceiling.   Hold this position for __________ seconds. You should feel the muscles in your outer hip lifting (you may not notice this until your leg begins to tire).   Slowly lower your leg to the starting position. Allow the muscles to fully relax before beginning the next repetition.  Repeat __________ times. Complete this exercise __________  times per day.  STRENGTH - Hip Abductors, Quadriped  On a firm, lightly padded surface, position yourself on your hands and knees. Your hands should be directly below your shoulders and your knees should be directly below your hips.   Keeping your right / left knee bent, lift your leg out to the side. Keep your legs level and in line with your shoulders.   Position yourself on your hands and knees.   Hold for __________ seconds.   Keeping your trunk steady and your hips level, slowly lower your leg to the starting position.  Repeat __________ times. Complete this exercise __________ times per day.  STRENGTH - Hip Abductors, Standing  Tie one end of a rubber exercise band/tubing to a secure surface (table, pole) and tie a loop at the other end.   Place the loop around your right / left ankle. Keeping your ankle with the band directly opposite of the secured end, step away until there is tension in the tube/band.   Hold onto a chair as needed for balance.   Keeping your back upright, your shoulders over your hips, and your toes pointing forward, lift your right / left leg out to your side. Be sure to lift your leg with your hip muscles. Do not "throw" your leg or tip your body to lift your leg.   Slowly and with control, return to the starting position.  Repeat exercise __________ times. Complete this exercise __________ times per day.  Document Released: 07/23/2005 Document Revised: 07/12/2011 Document Reviewed: 11/04/2008 [facility] Patient Information 2012 [place], [place].  Carpal Tunnel Syndrome Carpal tunnel syndrome is a disorder of the nervous system in the wrist that causes pain, hand weakness, and/or loss of feeling. Carpal tunnel syndrome is caused by the compression, stretching, or irritation of the median nerve at the wrist joint. Athletes who experience carpal tunnel syndrome may notice a decrease in their performance to the condition, especially for sports that require strong  hand or wrist action.  SYMPTOMS   Tingling, numbness, or burning pain in the hand or fingers.   Inability to sleep due to pain in the hand.   Sharp pains that shoot from the wrist up the arm or to the fingers, especially at night.   Morning stiffness or cramping of the hand.   Thumb weakness, resulting in difficulty holding objects or making a fist.   Shiny, dry skin on the hand.   Reduced performance in any sport requiring a strong grip.  CAUSES   Median nerve damage at the wrist is caused by pressure due to swelling, inflammation, or scarred tissue.   Sources of pressure include:   Repetitive gripping or squeezing that causes inflammation of  the tendon sheaths.   Scarring or shortening of the ligament that covers the median nerve.   Traumatic injury to the wrist or forearm such as fracture, sprain, or dislocation.   Prolonged hyperextension (wrist bent backward) or hyperflexion (wrist bent downward) of the wrist.  RISK INCREASES WITH:  Diabetes mellitus.   Menopause or amenorrhea.   Rheumatoid arthritis.   Raynaud's disease.   Pregnancy.   Gout.   Kidney disease.   Ganglion cyst.   Repetitive hand or wrist action.   Hypothyroidism (underactive thyroid gland).   Repetitive jolting or shaking of the hands or wrist.   Prolonged forceful weight-bearing on the hands.  PREVENTION  Bracing the hand and wrist straight during activities that involve repetitive grasping.   For activities that require prolonged extension of the wrist (bending towards the top of the forearm) periodically change the position of your wrists.   Learn and use proper technique in activities that result in the wrist position in neutral to slight extension.   Avoid bending the wrist into full extension or flexion (up or down)   Keep the wrist in a straight (neutral) position. To keep the wrist in this position, wear a splint.   Avoid repetitive hand and wrist motions.   When  possible avoid prolonged grasping of items (steering wheel of a car, a pen, a vacuum cleaner, or a rake).   Loosen your grip for activities that require prolonged grasping of items.   Place keyboards and writing surfaces at the correct height as to decrease strain on the wrist and hand.   Alternate work tasks to avoid prolonged wrist flexion.   Avoid pinching activities (needlework and writing) as they may irritate your carpal tunnel syndrome.   If these activities are necessary, complete them for shorter periods of time.   When writing, use a felt tip or roller ball pen and/or build up the grip on a pen to decrease the forces required for writing.  PROGNOSIS  Carpal tunnel syndrome is usually curable with appropriate conservative treatment and sometimes resolves spontaneously. For some cases, surgery is necessary, especially if muscle wasting or nerve changes have developed.  RELATED COMPLICATIONS   Permanent numbness and a weak thumb or fingers in the affected hand.   Permanent paralysis of a portion of the hand and fingers.  TREATMENT  Treatment initially consists of stopping activities that aggravate the symptoms as well as medication and ice to reduce inflammation. A wrist splint is often recommended for wear during activities of repetitive motion as well as at night. It is also important to learn and use proper technique when performing activities that typically cause pain. On occasion, a corticosteroid injection may be given. If symptoms persist despite conservative treatment, surgery may be an option. Surgical techniques free the pinched or compressed nerve. Carpal tunnel surgery is usually performed on an outpatient basis, meaning you go home the same day as surgery. These procedures provide almost complete relief of all symptoms in 95% of patients. Expect at least 2 weeks for healing after surgery. For cases that are the result of repeated jolting or shaking of the hand or wrist or  prolonged hyperextension, surgery is not usually recommended, because stretching of the median nerve and not compression are usually the cause of carpal tunnel syndrome in these cases. MEDICATION   If pain medication is necessary, nonsteroidal anti-inflammatory medications, such as aspirin and ibuprofen, or other minor pain relievers, such as acetaminophen, are often recommended.   Do not take  pain medication for 7 days before surgery.   Prescription pain relievers are usually only prescribed after surgery. Use only as directed and only as much as you need.   Corticosteroid injections may be given to reduce inflammation. However, they are not always recommended.   Vitamin B6 (pyridoxine) may reduce symptoms; use only if prescribed for your disorder.  SEEK MEDICAL CARE IF:   Symptoms get worse or do not improve in 2 weeks despite treatment.   You also have a current or recent history of neck or shoulder injury that has resulted in pain or tingling elsewhere in your arm.  Document Released: 07/23/2005 Document Revised: 07/12/2011 Document Reviewed: 11/04/2008 [facility] Patient Information 2012 [place], [place].

## 2012-02-22 NOTE — Assessment & Plan Note (Signed)
Corticosteroid injection on the right. Continue the exercises and we gave him new handout for that. We'll try switching his amitriptyline to Neurontin. He'll followup 4-6 weeks.

## 2012-02-22 NOTE — Progress Notes (Signed)
  Subjective:    Patient ID: Brian Morgan, male    DOB: [birth year], 66 y.o.   MRN: [id number]  HPI  Return of symptoms of carpal tunnel. He had an injection many months ago and that really helped but the last few weeks she's been having the numbness again in the right thumb and first 2 fingers. Is keeping him awake at night. He said no new injury. He saw Dr. Laural Benes who tried him on amitriptyline. That makes him for very hot over even when he tried cutting the tablet in half. He plans to try something different he would like to try corticosteroid injection today.  PERTINENT  PMH / PSH: Hypertension No diabetes mellitus.  Review of Systems    Denies fever, sweats, chills. Objective:   Physical Exam  Vital signs reviewed. GENERAL: Well developed, well nourished, no acute distress HAND: Right. Normal motion of all fingers. Normal grip strength. Normal abduction and abduction.No thenar atrophy noted on the right. SKIN: No rash or erythema noted on the wrist or hand on the right. WRISTS: Right. Positive Tinel's at the wrist.  INJECTION: Patient was given informed consent, signed copy in the chart. Appropriate time out was taken. Area prepped and draped in usual sterile fashion. 1/2/ cc of methylprednisolone 40 mg/ml plus  1/2 cc of 1% lidocaine without epinephrine was injected into the right carpal tunnel using a(n) volar approach. The patient tolerated the procedure well. There were no complications. Post procedure instructions were given.     Assessment & Plan:

## 2012-03-25 ENCOUNTER — Ambulatory Visit (INDEPENDENT_AMBULATORY_CARE_PROVIDER_SITE_OTHER): Payer: Self-pay | Admitting: Family Medicine

## 2012-03-25 ENCOUNTER — Encounter: Payer: Self-pay | Admitting: Family Medicine

## 2012-03-25 VITALS — BP 112/65 | HR 68 | Temp 98.6°F | Ht 72.0 in | Wt 170.1 lb

## 2012-03-25 DIAGNOSIS — N486 Induration penis plastica: Secondary | ICD-10-CM

## 2012-03-25 NOTE — Patient Instructions (Addendum)
It has been a pleasure to see you today. The referral for urology has been placed. Our staff will call [facility] and will contact you with information regarding this referral.

## 2012-03-26 NOTE — Assessment & Plan Note (Signed)
Based on history provided by pt. Pt declined physical exam. Plan Referral placed. Nursing staff will help to schedule an appointment at [facility].

## 2012-03-26 NOTE — Progress Notes (Signed)
  Subjective:    Patient ID: Brian Morgan, male    DOB: [birth year], 66 y.o.   MRN: [id number]  HPI Pt that comes today accompanied by his wife with the complaint of urology referral to [facility]. He states that in 2010 when he was in prison he was diagnosed by a doctor with Peyronie Disease. He came for an office visit on Jan/13 to our clinic and the referral was placed but due to his financial situation this referral has not been fulfilled. He states that a  nurse at [facility]) mentioned to him that with a referral order he could go to [facility] and they will look into his financial information to evaluate the possibility of getting seen. Pt states that he knows what he has and it is consistent with this diagnosis. He described his penis with a lateral band of hardened tissue that has changed the shape of his penis. He reports that since his diagnosis in 2010 has gotten worse. He would like to get it fixed since this affects his sexual life.  Review of Systems     Objective:   Physical Exam Pt denies physical exam on this visit.      Assessment & Plan:

## 2012-03-28 ENCOUNTER — Other Ambulatory Visit: Payer: Self-pay | Admitting: Family Medicine

## 2012-03-28 ENCOUNTER — Encounter: Payer: Self-pay | Admitting: Family Medicine

## 2012-03-28 ENCOUNTER — Ambulatory Visit (INDEPENDENT_AMBULATORY_CARE_PROVIDER_SITE_OTHER): Payer: Self-pay | Admitting: Family Medicine

## 2012-03-28 VITALS — BP 152/78 | Ht 72.0 in | Wt 171.0 lb

## 2012-03-28 DIAGNOSIS — M5430 Sciatica, unspecified side: Secondary | ICD-10-CM

## 2012-03-28 DIAGNOSIS — M5410 Radiculopathy, site unspecified: Secondary | ICD-10-CM

## 2012-03-28 DIAGNOSIS — IMO0002 Reserved for concepts with insufficient information to code with codable children: Secondary | ICD-10-CM

## 2012-03-28 MED ORDER — TRAMADOL HCL 50 MG PO TABS: 50.0000 mg | ORAL_TABLET | Freq: Four times a day (QID) | ORAL | Status: AC | PRN

## 2012-03-28 NOTE — Telephone Encounter (Signed)
No more refills aproved, pt needs to come for office visit for f/u chronic conditions.

## 2012-03-28 NOTE — Progress Notes (Signed)
DEPO MEDROL 80 MG IM R GLUTEUS MEDIUS GIVEN BY DR Denyse Amass LOT: [id number] EXP: 10/2014 [facility]

## 2012-03-28 NOTE — Progress Notes (Signed)
[facility] Attending Note: I have seen and examined this patient. I have discussed this patient with the resident and reviewed the assessment and plan as documented above. I agree with the resident's findings and plan. With leg weakness and change in DTR I agree it is time to get MRI LS spine. Denny Levy

## 2012-03-28 NOTE — Patient Instructions (Addendum)
Thank you for coming in today. Try tramdol as needed 1 pill every 6 hours for pain.  Increase gabapentin to 600mg   (two pills) at night.  We will get that MRI ASAP.  Come back or go to the emergency room if you notice new weakness new numbness problems walking or bowel or bladder problems. We will call after the MRI to figure out a plan.    You have been scheduled for a MRI at [facility] on 04/01/12 at 2 pm- please arrive at radiology on the 1st floor at 1:45pm.

## 2012-03-28 NOTE — Progress Notes (Signed)
Brian Morgan is a 66 y.o. male who presents to [facility] today for low back pain. Patient has had left lumbar low back pain for several years. However it worsened 1.5 weeks ago. He notes significant (8/10) low back pain with radicular pain to his toe on the left side.  He denies any weakness but does note numbness on the lateral lower leg and dorsal foot.  He denies any trouble walking or bowel or bladder dysfunction.  No fevers chills or weight loss.  Gabapentin is a somewhat effective.     PMH reviewed. Hypertension History  Substance Use Topics  . Smoking status: Former Smoker    Types: Cigarettes    Quit date: 09/03/2003  . Smokeless tobacco: Former Neurosurgeon  . Alcohol Use: Yes     every few months.   ROS as above otherwise neg   Exam:  BP 152/78  Ht 6' (1.829 m)  Wt 171 lb (77.565 kg)  BMI 23.19 kg/m2 Gen: Well NAD MSK: Back: Nontender over spinal midline.  Mildly tender over left SI joint.  Normal back motion.  Negative straight leg raise test negative Faber test.  Reflexes diminished on left side normal on right.  Strength intact.  Diminished sensation on the lateral lower leg.   Patient can stand on toes, stand on heels, and get on and off exam table by himself.

## 2012-03-28 NOTE — Assessment & Plan Note (Signed)
Patient with significant radicular back pain.  Now with some motor and sensory neuron impairment.   Plan for lumbar MRI.  Additionally the Depo-Medrol IM injection. Additionally we'll prescribe tramadol.  Followup via phone after MRI.  Discussed warning signs or symptoms. Please see discharge instructions. Patient expresses understanding.

## 2012-04-01 ENCOUNTER — Telehealth: Payer: Self-pay | Admitting: *Deleted

## 2012-04-01 ENCOUNTER — Ambulatory Visit (HOSPITAL_COMMUNITY)
Admission: RE | Admit: 2012-04-01 | Discharge: 2012-04-01 | Disposition: A | Discharge status: Home / Self Care | Payer: Self-pay | Source: Ambulatory Visit | Attending: Family Medicine | Admitting: Family Medicine

## 2012-04-01 ENCOUNTER — Other Ambulatory Visit: Payer: Self-pay | Admitting: *Deleted

## 2012-04-01 DIAGNOSIS — M48061 Spinal stenosis, lumbar region without neurogenic claudication: Secondary | ICD-10-CM | POA: Insufficient documentation

## 2012-04-01 DIAGNOSIS — M5137 Other intervertebral disc degeneration, lumbosacral region: Secondary | ICD-10-CM | POA: Insufficient documentation

## 2012-04-01 DIAGNOSIS — M5430 Sciatica, unspecified side: Secondary | ICD-10-CM

## 2012-04-01 DIAGNOSIS — M79609 Pain in unspecified limb: Secondary | ICD-10-CM | POA: Insufficient documentation

## 2012-04-01 DIAGNOSIS — M51379 Other intervertebral disc degeneration, lumbosacral region without mention of lumbar back pain or lower extremity pain: Secondary | ICD-10-CM | POA: Insufficient documentation

## 2012-04-01 DIAGNOSIS — R209 Unspecified disturbances of skin sensation: Secondary | ICD-10-CM | POA: Insufficient documentation

## 2012-04-01 MED ORDER — GABAPENTIN 300 MG PO CAPS: 600.0000 mg | ORAL_CAPSULE | Freq: Every day | ORAL | Status: DC

## 2012-04-01 NOTE — Telephone Encounter (Signed)
New rx sent with corrected directions.

## 2012-04-01 NOTE — Telephone Encounter (Signed)
Message copied by Mora Bellman on Tue Apr 01, 2012  4:50 PM ------      Message from: Lizbeth Bark      Created: Tue Apr 01, 2012  3:58 PM      Regarding: phone message      Contact: [phone]       Pt left a message stating he needs a [facility] for gabapentin. He said that dr. Jeanie Sewer upped his dosage to 600 mg. At bedtime

## 2012-04-03 ENCOUNTER — Telehealth: Payer: Self-pay | Admitting: Family Medicine

## 2012-04-03 NOTE — Telephone Encounter (Signed)
Hasn't heard anything about the referral to [facility] - was told to call if he hadn't heard

## 2012-04-04 ENCOUNTER — Ambulatory Visit: Payer: Self-pay | Admitting: Family Medicine

## 2012-04-08 NOTE — Telephone Encounter (Signed)
Called pt and informed him of the following:  Appt with Outpatient Urology on  September 25.2013 (Wednesday) @ 300 PM pt to arrive @ 245 PM Pt will need to bring in $20.00 for co-pay and call Financial counseling @ [phone]. If he cannot keep this appt he will need to call their office 24 hours to cancel/reschedule @ [phone]  or he may be charged a fee or not be rescheduled. Pt repeated the above information and voiced understanding and agreed.Loralee Pacas [place]

## 2012-04-14 ENCOUNTER — Telehealth: Payer: Self-pay | Admitting: *Deleted

## 2012-04-14 ENCOUNTER — Other Ambulatory Visit: Payer: Self-pay | Admitting: *Deleted

## 2012-04-14 NOTE — Telephone Encounter (Signed)
Spoke with pt- advised him of MRI results.  He states he would like to pursue any other treatment options after his medicare is active.  He will call the office to let us know when to proceed with consultation referrals.

## 2012-04-14 NOTE — Telephone Encounter (Addendum)
Called pt- left VM to return my call. Pt has new Rx for gabapentin already sent to [facility]. Per Dr. Jennette Kettle pt's MRI showed arthritic changes and buldging disc.  She is unsure if surgery will help, but we can refer him to [facility] ortho for consultation, they require $200 up front.

## 2012-04-14 NOTE — Telephone Encounter (Signed)
Message copied by Mora Bellman on Mon Apr 14, 2012  2:25 PM ------      Message from: Claiborne Billings      Created: Mon Apr 14, 2012  9:44 AM       1) MRI findings Janace Litten- please call      2) New prescription for neurontin

## 2012-04-24 ENCOUNTER — Ambulatory Visit (INDEPENDENT_AMBULATORY_CARE_PROVIDER_SITE_OTHER): Payer: Self-pay | Admitting: Gastroenterology

## 2012-04-24 DIAGNOSIS — B182 Chronic viral hepatitis C: Secondary | ICD-10-CM

## 2012-04-24 LAB — CBC WITH DIFFERENTIAL/PLATELET
Basophils Absolute: 0 10*3/uL (ref 0.0–0.1)
Basophils Relative: 0 % (ref 0–1)
Eosinophils Absolute: 0.5 10*3/uL (ref 0.0–0.7)
MCH: 31.6 pg (ref 26.0–34.0)
MCHC: 34.8 g/dL (ref 30.0–36.0)
Monocytes Relative: 8 % (ref 3–12)
Neutro Abs: 4.2 10*3/uL (ref 1.7–7.7)
Neutrophils Relative %: 57 % (ref 43–77)
Platelets: 228 10*3/uL (ref 150–400)
RDW: 12.8 % (ref 11.5–15.5)

## 2012-04-24 LAB — IBC PANEL
%SAT: 34 % (ref 20–55)
TIBC: 300 ug/dL (ref 215–435)

## 2012-04-24 LAB — IRON: Iron: 103 ug/dL (ref 42–165)

## 2012-04-24 LAB — TSH: TSH: 1.501 u[IU]/mL (ref 0.350–4.500)

## 2012-04-24 LAB — FERRITIN: Ferritin: 102 ng/mL (ref 22–322)

## 2012-04-25 ENCOUNTER — Telehealth: Payer: Self-pay | Admitting: *Deleted

## 2012-04-25 LAB — COMPLETE METABOLIC PANEL WITH GFR
AST: 21 U/L (ref 0–37)
Albumin: 4 g/dL (ref 3.5–5.2)
BUN: 20 mg/dL (ref 6–23)
Calcium: 9.3 mg/dL (ref 8.4–10.5)
Chloride: 106 mEq/L (ref 96–112)
Creat: 1.04 mg/dL (ref 0.50–1.35)
GFR, Est African American: 87 mL/min
Glucose, Bld: 84 mg/dL (ref 70–99)
Total Protein: 6.4 g/dL (ref 6.0–8.3)

## 2012-04-25 LAB — HEPATITIS B SURFACE ANTIBODY,QUALITATIVE: Hep B S Ab: NEGATIVE

## 2012-04-25 LAB — PROTIME-INR
INR: 1.01 (ref <1.50)
Prothrombin Time: 13.3 seconds (ref 11.6–15.2)

## 2012-04-25 LAB — HEPATITIS A ANTIBODY, TOTAL: Hep A Total Ab: NEGATIVE

## 2012-04-25 LAB — HEPATITIS B SURFACE ANTIGEN: Hepatitis B Surface Ag: NEGATIVE

## 2012-04-25 LAB — AFP TUMOR MARKER: AFP-Tumor Marker: 2.7 ng/mL (ref 0.0–8.0)

## 2012-04-25 MED ORDER — TRAMADOL HCL 50 MG PO TABS: 50.0000 mg | ORAL_TABLET | Freq: Four times a day (QID) | ORAL | Status: DC | PRN

## 2012-04-25 NOTE — Telephone Encounter (Signed)
Message copied by Mora Bellman on Fri Apr 25, 2012  8:53 AM ------      Message from: CERESI, Shawna Orleans L      Created: Thu Apr 24, 2012  1:39 PM      Regarding: RX REQUEST      Contact: [phone]       Pt requested a refill on Tramadol 50mg   [facility]

## 2012-04-25 NOTE — Telephone Encounter (Signed)
Refilled per Dr. Neal.  

## 2012-04-29 LAB — HEPATITIS C GENOTYPE

## 2012-05-05 NOTE — Progress Notes (Signed)
Brian Morgan, Brian Morgan    MR#:  [id number]      DATE:  04/24/2012  DOB:  07-Feb-1946    cc:     REFERRING PHYSICIAN: Dayarmys Piloto Rolene Arbour, MD, [facility].  PRIMARY CARE PHYSICIAN: Dayarmys Piloto Rolene Arbour, MD, [facility].  consulting physician: Roxine Caddy. Jennette Kettle, MD, [facility].  reason for referral: Positive HCV RNA.  HISTORY:  The patient is a 66 year old gentleman who I have been asked to see in consultation by Dr. Hillis Range regarding his positive HCV RNA.   According to the patient, in 2001 he donated blood to the [employer] and received a letter he was hepatitis C positive. He was then incarcerated between 2001 and 2012 in a [place] state prison but he was told he was a "carrier" of hepatitis C, but I have no further details of this and there is no such status for hepatitis C. He has established care with [facility] and his doctors there have determined that he has a detectable viral load.   There are no symptoms referable to his history of hepatitis C nor symptoms to suggest cryoglobulin mediated or decompensated liver disease.   With respect to risk factors for liver disease, he rarely consumes alcohol and reports his last consumption was in 02/2012. He denies any history of DWIs. There is a history of intravenous cocaine use until 2001, but none since. He has tattoos but there is no history of unsterile body piercing or blood transfusion prior to 1992. His family history is significant for a maternal grandfather who had alcohol-induced liver disease. He believes he has been vaccinated against hepatitis A and B.   PAST MEDICAL HISTORY:  Significant for hypertension. He also has a history of asthma. Years ago he took oral medication on a regular basis and now for approximately 30 years he uses an inhaler p.r.n. particularly during pollen season and rapid changes in weather from hot to cold, but otherwise does  not require any other medical therapy for asthma.   PAST SURGICAL HISTORY:  Tonsillectomy and adenectomy, left wrist surgery after trauma.   PAST PSYCHIATRIC HISTORY:  Denies.   CURRENT MEDICATIONS:  1. Hydrochlorothiazide 12.5 mg daily.  2. Lisinopril 20 mg daily.  3. Ventolin inhaler 2 inhalations 6 hours p.r.n.  4. Amitriptyline 25 mg at bedtime.  5. Neurontin 600 mg at bedtime.   ALLERGIES:  PENICILLIN and aspirin cause hives.  HABITS:  Smoking, quit 8 years ago. Alcohol as above.   FAMILY HISTORY:  As above.   SOCIAL HISTORY:  Married and accompanied by his wife today. He has 2 children, a daughter age 28 and a son age 43. 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   REVIEW OF SYSTEMS:  All 10 systems reviewed today with the patient on the review of systems form which was signed and placed in the chart. His CES-D was 10.   PHYSICAL EXAMINATION  Constitutional:  Thin without stigmata of chronic liver disease. Vital signs: Height 72 inches, weight 168 pounds, blood pressure 140/84, pulse 67, temperature 97.4 Fahrenheit. Ears, Nose, Mouth and Throat:  Unremarkable oropharynx.  No thyromegaly or neck masses.  Chest:  Resonant to percussion.  Clear to auscultation.  Cardiovascular:  Heart sounds normal S1, S2 without murmurs or rubs.  There is no peripheral edema.  Abdomen:  Normal bowel sounds.  No masses or tenderness.  I could  not appreciate a liver edge or spleen tip.  I could not appreciate any hernias.  Lymphatics:  No cervical or inguinal lymphadenopathy.  Central Nervous System:  No asterixis or focal neurologic findings.  Dermatologic:  Anicteric without palmar erythema or spider angiomata.  Eyes:  Anicteric sclerae.  Pupils are equal and reactive to light.  LABORATORIES:  Previous laboratory work was in [employer], reviewed. The only relevant lab was an HCV RNA on 09/07/2011 showing a viral load of 1,610,960 international units per mL or 6.14 log  international units per mL.   The only imaging of his liver that I have is 03/10/2004 which showed the liver was unremarkable other than multiple cysts.   ASSESSMENT:  The patient is a 66 year old gentleman with history of positive HCV RNA, who appears to be clinically well compensated. I do not see any contraindications to treating his hepatitis C. Of course, he will need to be further characterized with genotyping. At this point, with the impending availability of sofosbuvir for all genotypes, it may not be worth biopsying him even if he is genotype 1.    In my discussion today with the patient and his wife, we discussed the nature and natural history of hepatitis C. We discussed the significance of genotyping him. We discussed treatment for hepatitis C with interferon and ribavirin as the base for all genotypes and the addition of a protease inhibitor for genotype 1. We also discussed the possibility of waiting for the availability of sofosbuvir after 07/2012. The patient is aware of the treatment for hepatitis C. He is particularly concerned of the psychiatric side effects of the interferon based on his observation of patients who are friends. He is adamant that he does not want interferon-based therapy based on his observation of these other patients. I have explained to him that with the availability of sofosbuvir treatment with interferon will be substantially shortened if he is genotype 1. Nevertheless, he does not want to be treated with interferon based therapies. I explained to him, therefore, that it would not be worth biopsying him if he is not going to change his mind about treatment. He indicated a biopsy showing advanced fibrosis would not change his mind about accepting interferon based therapies. I have explained therefore that we have to wait for the availability of all oral therapies in latter 2014-2015. I have also discussed the risk of contagion and the need to avoid alcohol together.    PLAN:  1. Test for hepatitis B surface antigen.  2. Hepatitis A and B immunity.  3. Genotype.  4. Standard labs.  5. Literature on hepatitis C given.  6. Return in 11/2012 in [facility], at which time he will have Medicare and not rely on the [facility] for assistance.               Brooke Dare, MD   ADDENDUM: Genotype 1a.  Hepatitis A and B nave.  I suggest vaccination against Hep A and B by his primary if possible while waiting for follow up appointment.  403 .[id number]  D:  Thu Sep 19 20:02:39 2013 ; T:  Fri Sep 20 11:08:42 2013  Job #:  [id number]

## 2012-05-06 ENCOUNTER — Ambulatory Visit (INDEPENDENT_AMBULATORY_CARE_PROVIDER_SITE_OTHER): Payer: Self-pay | Admitting: *Deleted

## 2012-05-06 DIAGNOSIS — Z23 Encounter for immunization: Secondary | ICD-10-CM

## 2012-05-09 ENCOUNTER — Ambulatory Visit (INDEPENDENT_AMBULATORY_CARE_PROVIDER_SITE_OTHER): Payer: Self-pay | Admitting: Family Medicine

## 2012-05-09 VITALS — BP 120/70 | Ht 72.0 in | Wt 171.0 lb

## 2012-05-09 DIAGNOSIS — M1990 Unspecified osteoarthritis, unspecified site: Secondary | ICD-10-CM

## 2012-05-12 ENCOUNTER — Encounter: Payer: Self-pay | Admitting: Family Medicine

## 2012-05-12 NOTE — Progress Notes (Signed)
  Subjective:    Patient ID: Brian Morgan, male    DOB: 11-Oct-1945, 66 y.o.   MRN: [id number]  HPI Left knee pain. Had injection several months ago and did well until about 2 weeks ago when the pain came back full force. Complains of 9/10 pain all the time. Is keeping him from sleeping at night. Worse if he does a lot of walking or standing. He is hoping to get insurance in April and plans to pursue evaluation for total knee replacement at that time. He would like to continue to get corticosteroid shots and use tramadol until then.   Review of Systems Has noted no erythema, warmth or swelling of the left knee. Denies fever, sweats, chills.    Objective:   Physical Exam Vital signs are reviewed GENERAL: Well-developed male no acute distress KNEES: Left knee tender to palpation across the medial and lateral joint line. He has full flexion and lacks full extension by 5. He has some crepitus with extension. There is no effusion. He is ligamentously intact to varus and valgus stress. Painful McMurray but without a pop. The calf is soft. Distally he is neurovascularly intact. INJECTION: Patient was given informed consent, signed copy in the chart. Appropriate time out was taken. Area prepped and draped in usual sterile fashion. One cc of methylprednisolone 40 mg/ml plus  4 cc of 1% lidocaine without epinephrine was injected into the left knee using a(n) anterior medial approach. The patient tolerated the procedure well. There were no complications. Post procedure instructions were given.      Assessment & Plan:  DJD left knee. Corticosteroid injection today. He wasn't me know when he gets insurance. It would be helpful to go ahead and get some x-rays prior to sending him for evaluation for TKR. As he has no insurance now we can wait till he gets insurance before we get films as I don't think he'll change his current treatment.

## 2012-05-23 ENCOUNTER — Ambulatory Visit (INDEPENDENT_AMBULATORY_CARE_PROVIDER_SITE_OTHER): Payer: Self-pay | Admitting: Family Medicine

## 2012-05-23 VITALS — BP 156/87 | HR 96 | Temp 98.0°F | Ht 72.0 in | Wt 169.4 lb

## 2012-05-23 DIAGNOSIS — I10 Essential (primary) hypertension: Secondary | ICD-10-CM

## 2012-05-23 DIAGNOSIS — N529 Male erectile dysfunction, unspecified: Secondary | ICD-10-CM

## 2012-05-23 DIAGNOSIS — H524 Presbyopia: Secondary | ICD-10-CM

## 2012-05-23 MED ORDER — LISINOPRIL 20 MG PO TABS: 20.0000 mg | ORAL_TABLET | Freq: Every day | ORAL | Status: DC

## 2012-05-23 MED ORDER — TADALAFIL 20 MG PO TABS: 10.0000 mg | ORAL_TABLET | ORAL | Status: DC | PRN

## 2012-05-23 MED ORDER — HYDROCHLOROTHIAZIDE 12.5 MG PO TABS: 12.5000 mg | ORAL_TABLET | Freq: Every day | ORAL | Status: DC

## 2012-05-23 NOTE — Patient Instructions (Addendum)
It has been a pleasure to see you today. Please take the medications as prescribed. Make your next appointment in 3-4 months for f/u or sooner if needed.  Tadalafil tablets (Cialis) What is this medicine? TADALAFIL (tah DA la fil) is used to treat erection problems in men. It is also used for enlargement of the prostate gland in men, a condition called benign prostatic hyperplasia or BPH. This medicine improves urine flow and reduces BPH symptoms. This medicine can also treat both erection problems and BPH when they occur together. This medicine may be used for other purposes; ask your health care provider or pharmacist if you have questions. What should I tell my health care provider before I take this medicine? They need to know if you have any of these conditions: -bleeding disorders -eye or vision problems, including a rare inherited eye disease called retinitis pigmentosa -anatomical deformation of the penis, Peyronie's disease, or history of priapism (painful and prolonged erection) -heart disease, angina, a history of heart attack, irregular heart beats, or other heart problems -high or low blood pressure -history of blood diseases, like sickle cell anemia or leukemia -history of stomach bleeding -kidney disease -liver disease -stroke -an unusual or allergic reaction to tadalafil, other medicines, foods, dyes, or preservatives -pregnant or trying to get pregnant -breast-feeding How should I use this medicine? Take this medicine by mouth with a glass of water. Follow the directions on the prescription label. You may take this medicine with or without meals. When this medicine is used for erection problems, your doctor may prescribe it to be taken once daily or as needed. If you are taking the medicine as needed, you may be able to have sexual activity 30 minutes after taking it and for up to 36 hours after taking it. Whether you are taking the medicine as needed or once daily, you  should not take more than one dose per day. If you are taking this medicine for symptoms of benign prostatic hyperplasia (BPH) or to treat both BPH and an erection problem, take the dose once daily at about the same time each day. Do not take your medicine more often than directed. Talk to your pediatrician regarding the use of this medicine in children. Special care may be needed. Overdosage: If you think you have taken too much of this medicine contact a poison control center or emergency room at once. NOTE: This medicine is only for you. Do not share this medicine with others. What if I miss a dose? If you are taking this medicine as needed for erection problems, this does not apply. If you miss a dose while taking this medicine once daily for an erection problem, benign prostatic hyperplasia, or both, take it as soon as you remember, but do not take more than one dose per day. What may interact with this medicine? Do not take this medicine with any of the following medications: -nitrates like amyl nitrite, isosorbide dinitrate, isosorbide mononitrate, nitroglycerin -other medicines for erectile dysfunction like avanafil, sildenafil, vardenafil -other tadalafil products (Adcirca)  This medicine may also interact with the following medications: -certain drugs for high blood pressure -certain drugs for the treatment of HIV infection or AIDS -certain drugs used for fungal or yeast infections, like fluconazole, itraconazole, ketoconazole, and voriconazole -certain drugs used for seizures like carbamazepine, phenytoin, and phenobarbital -grapefruit juice -macrolide antibiotics like clarithromycin, erythromycin, troleandomycin -medicines for prostate problems -rifabutin, rifampin or rifapentine This list may not describe all possible interactions. Give your health care  provider a list of all the medicines, herbs, non-prescription drugs, or dietary supplements you use. Also tell them if you smoke,  drink alcohol, or use illegal drugs. Some items may interact with your medicine. What should I watch for while using this medicine? If you notice any changes in your vision while taking this drug, call your doctor or health care professional as soon as possible. Stop using this medicine and call your health care provider right away if you have a loss of sight in one or both eyes. Contact you doctor or health care professional right away if the erection lasts longer than 4 hours or if it becomes painful. This may be a sign of serious problem and must be treated right away to prevent permanent damage. If you experience symptoms of nausea, dizziness, chest pain or arm pain upon initiation of sexual activity after taking this medicine, you should refrain from further activity and call your doctor or health care professional as soon as possible. Do not drink alcohol to excess (examples, 5 glasses of wine or 5 shots of whiskey) when taking this medicine. When taken in excess, alcohol can increase your chances of getting a headache or getting dizzy, increasing your heart rate or lowering your blood pressure. Using this medicine does not protect you or your partner against HIV infection (the virus that causes AIDS) or other sexually transmitted diseases. What side effects may I notice from receiving this medicine? Side effects that you should report to your doctor or health care professional as soon as possible: -allergic reactions like skin rash, itching or hives, swelling of the face, lips, or tongue -breathing problems -changes in hearing -changes in vision -chest pain -erection lasting more than 4 hours -fast, irregular heartbeat -seizures  Side effects that usually do not require medical attention (report to your doctor or health care professional if they continue or are bothersome): -back pain -dizziness -flushing -headache -indigestion -muscle aches -nausea -stuffy or runny nose This list  may not describe all possible side effects. Call your doctor for medical advice about side effects. You may report side effects to FDA at 1-800-FDA-1088. Where should I keep my medicine? Keep out of the reach of children. Store at room temperature between 15 and 30 degrees C (59 and 86 degrees F). Throw away any unused medicine after the expiration date. NOTE: This sheet is a summary. It may not cover all possible information. If you have questions about this medicine, talk to your doctor, pharmacist, or health care provider.  2012, Elsevier/Gold Standard. (01/16/2011 1:19:37 PM)

## 2012-05-24 ENCOUNTER — Encounter: Payer: Self-pay | Admitting: Family Medicine

## 2012-05-24 DIAGNOSIS — N529 Male erectile dysfunction, unspecified: Secondary | ICD-10-CM | POA: Insufficient documentation

## 2012-05-24 NOTE — Progress Notes (Signed)
Patient ID: Brian Morgan, male   DOB: 06-Mar-1946, 66 y.o.   MRN: [id number] Family Medicine Office Visit Note   Subjective:   Patient ID: Brian Morgan, male  DOB: August 06, 1946, 66 y.o.. MRN: [id number]   Pt that comes today with the complaint of erectile dysfunction. He has difficulty obtaining an erection. He has not known hx of cardiovascular disease, nor family hx of sudden cardiac death. He is asking for cialis or viagra to help with his condition. He has hx of peyronie's disease and was recently evaluated at [facility] (urology). It was recommended to take Vit E 1000 mg daily and reevaluate in 3 months. Pt states that per urology evaluation it  is ok to use medication to help with sexual dysfunction.  He also requests today optometry referral. His case manager will approve this referral in order to finance his eye exam and glasses.   Review of Systems:  Pt denies SOB, chest pain, palpitations, headaches, dizziness, numbness or weakness. No changes on urinary or BM habits. No unintentional weigh loss/gain.  Objective:   Physical Exam: Gen:  NAD HEENT: Moist mucous membranes  CV: Regular rate and rhythm, no murmurs rubs or gallops PULM: Clear to auscultation bilaterally. No wheezes/rales/rhonchi ABD: Soft, non tender, non distended, normal bowel sounds EXT: No edema Neuro: Alert and oriented x3. No focalization Pt decline Urogenital Exam.  Assessment & Plan:

## 2012-05-24 NOTE — Assessment & Plan Note (Signed)
No DM or other conditions that may be the cause. Hypogonadism is a possibility. Pt declines testing, he would like to try medication first. Plan: Since pt does not have increased risks factors(non smoker, no HTN, no CV disease, no DM, no use of alfa adrenergic medication) Cialis prescription given today and discussed/hand out given about possible side effects of this medication as well as interaction with other drugs.

## 2012-05-30 ENCOUNTER — Telehealth: Payer: Self-pay | Admitting: Family Medicine

## 2012-05-30 NOTE — Telephone Encounter (Signed)
Patient needs a referral sent to [facility] - Vania Rea or Clydie Braun, stating that he needs to see an Optometrist.  He said that there was a fax sent previously, but they did not receive it.  The fax # is [phone].

## 2012-06-05 NOTE — Telephone Encounter (Signed)
This was a external referral that I ordered and EPIC generated it as a printed version. It was given to pt the day of his visit. Please let pt know this.

## 2012-07-09 ENCOUNTER — Other Ambulatory Visit: Payer: Self-pay | Admitting: Family Medicine

## 2012-07-17 ENCOUNTER — Ambulatory Visit (INDEPENDENT_AMBULATORY_CARE_PROVIDER_SITE_OTHER): Payer: Medicaid Other | Admitting: Family Medicine

## 2012-07-17 ENCOUNTER — Encounter: Payer: Self-pay | Admitting: Family Medicine

## 2012-07-17 VITALS — BP 130/78 | Ht 72.0 in | Wt 175.0 lb

## 2012-07-17 DIAGNOSIS — G8929 Other chronic pain: Secondary | ICD-10-CM

## 2012-07-17 DIAGNOSIS — M549 Dorsalgia, unspecified: Secondary | ICD-10-CM

## 2012-07-17 DIAGNOSIS — M1990 Unspecified osteoarthritis, unspecified site: Secondary | ICD-10-CM

## 2012-07-17 NOTE — Progress Notes (Signed)
  Subjective:    Patient ID: Brian Morgan, male    DOB: [birth year], 66 y.o.   MRN: [id number]  HPI  Followup low back pain with radiation down the left leg. He says he's ready to consider surgery at this point as long as he could consider procedure they would not keep him out of work for more than a day or so. He's interested in seeing somebody about "laser surgery" of the back because he read about it and saw on TV. He has had chronic back pain for years. It is 2-3/10 most of the time but with weather changes a lot of walking it will become 8 or 9/10. It radiates down his left thigh to right below the knee and into the calf. He does not radiate into the foot. He's had no leg weakness. No leg numbness. No incontinence of bowel or bladder.  Followup left knee. We had given him corticosteroid injection last time which help some. He thinks he would like to pursue getting a knee replacement next year in about April and she thinks she'll have better insurance coverage then.  Review of Systems Denies unusual weight change. Denies fever. Please see history of present illness above for additional partner review of systems.    Objective:   Physical Exam  Vital signs are reviewed GENERAL: Well-developed male no acute distress BACK nontender to palpation. Has limited flexion at the hips to about 80 and limited hyper extension to about 5. Normal lateral rotation. Straight leg raise is negative in the seated and supine position bilaterally. Lower stream he strength is 5 out of 5 in hip flexor knee extensor knee flexor. DTRs are 1-2+ bilaterally equal at the knee and ankle. His gait is normal. IMAGING: I looked at his MRI which shows some L4-5 degenerative disease with some possible multi-factorial spinal stenosis and lateral recess stenosis. He does have a small grade 1 spondylolisthesis.      Assessment & Plan:

## 2012-07-17 NOTE — Assessment & Plan Note (Signed)
I will give him a referral to neurosurgery for an evaluation. I'm not really sure if he is a surgical candidate and not sure if he is a candidate for "laser surgery". I tried to explain that to him today. I know he has chronic pain and he may benefit from some type of intervention.

## 2012-07-17 NOTE — Assessment & Plan Note (Signed)
Regarding his knee pain I do think he'll probably need a total knee replacement. He'll get back with me in the spring he wants to pursue that. I did more him that there will be a significant recovery. If he undergoes total knee replacement.

## 2012-07-21 ENCOUNTER — Ambulatory Visit: Payer: Medicaid Other | Admitting: Family Medicine

## 2012-08-06 DIAGNOSIS — J449 Chronic obstructive pulmonary disease, unspecified: Secondary | ICD-10-CM

## 2012-08-06 HISTORY — DX: Chronic obstructive pulmonary disease, unspecified: J44.9

## 2012-08-07 ENCOUNTER — Encounter: Payer: Self-pay | Admitting: *Deleted

## 2012-08-07 NOTE — Patient Instructions (Signed)
DR [facility] NEUROSURGICAL APPT [phone] 1.17.14 PER NUDELMAN RECEPTIONIST

## 2012-08-11 ENCOUNTER — Encounter: Payer: Self-pay | Admitting: Family Medicine

## 2012-08-11 ENCOUNTER — Ambulatory Visit (INDEPENDENT_AMBULATORY_CARE_PROVIDER_SITE_OTHER): Payer: Medicaid Other | Admitting: Family Medicine

## 2012-08-11 VITALS — BP 127/78 | HR 59 | Temp 98.2°F | Ht 72.0 in | Wt 167.5 lb

## 2012-08-11 DIAGNOSIS — K648 Other hemorrhoids: Secondary | ICD-10-CM

## 2012-08-11 DIAGNOSIS — K649 Unspecified hemorrhoids: Secondary | ICD-10-CM

## 2012-08-11 MED ORDER — MESALAMINE 1000 MG RE SUPP: 1000.0000 mg | Freq: Every day | RECTAL | Status: DC

## 2012-08-11 NOTE — Progress Notes (Signed)
Family Medicine Office Visit Note   Subjective:   Patient ID: Brian Morgan, male  DOB: 01-21-1946, 67 y.o.. MRN: [id number]   Pt that comes today complaining of intermittent rectal bleeding for about 1 month. The rectal bleeding is described as mild bright red that started after he cleaned vigorously the area after defecation. He reports history of hemorrhoids in the past and this time his symptoms are accompanied of protrusion of mucosa-like structures through external anal sphincter. He denies any pain. Stools are soft but not mixed with blood, normal color. No abdominal pain, nausea or other symptoms are described   Review of Systems:  Pt denies SOB, chest pain, palpitations, headaches, dizziness, numbness or weakness. No changes on urinary or BM habits. No unintentional weigh loss/gain.  Objective:   Physical Exam: Gen:  NAD HEENT: Moist mucous membranes  CV: Regular rate and rhythm, no murmurs PULM: Clear to auscultation bilaterally. ABD: Soft, non tender, non distended, normal bowel sounds EXT: No edema Perianal area: Protrusion of rectal mucosa and external hemorrhoids present. No thrombosed area seen.   Assessment & Plan:

## 2012-08-11 NOTE — Assessment & Plan Note (Addendum)
Pt with hx last year or rectal bleeding. Colonoscopy ordered to be performed at [facility] (pt with financial difficulties) Not documentation found about this procedure. The source or rectal bleeding this time seems more obvious since pt has mild prolapse of external hemorrhoids. Pt declines referrals at this time due to financial constrains. He has used suppositories of hydrocortisone with no improvement. Plan: CANASA suppositories (consulted with Pharmacy since pt is allergic to ASA) Discussed with pt importance of f/u and colonoscopy. Only mild bleeding when wiping area, no active bleeding. Last CBC Hb was ~15. Will repeat if bleeding is significant. Stool softeners.

## 2012-08-11 NOTE — Patient Instructions (Addendum)
Hemorrhoids Hemorrhoids are enlarged (dilated) veins around the rectum. There are 2 types of hemorrhoids, and the type of hemorrhoid is determined by its location. Internal hemorrhoids occur in the veins just inside the rectum.They are usually not painful, but they may bleed.However, they may poke through to the outside and become irritated and painful. External hemorrhoids involve the veins outside the anus and can be felt as a painful swelling or hard lump near the anus.They are often itchy and may crack and bleed. Sometimes clots will form in the veins. This makes them swollen and painful. These are called thrombosed hemorrhoids. CAUSES Causes of hemorrhoids include:  Pregnancy. This increases the pressure in the hemorrhoidal veins.  Constipation.  Straining to have a bowel movement.  Obesity.  Heavy lifting or other activity that caused you to strain. TREATMENT Most of the time hemorrhoids improve in 1 to 2 weeks. However, if symptoms do not seem to be getting better or if you have a lot of rectal bleeding, your caregiver may perform a procedure to help make the hemorrhoids get smaller or remove them completely.Possible treatments include:  Rubber band ligation. A rubber band is placed at the base of the hemorrhoid to cut off the circulation.  Sclerotherapy. A chemical is injected to shrink the hemorrhoid.  Infrared light therapy. Tools are used to burn the hemorrhoid.  Hemorrhoidectomy. This is surgical removal of the hemorrhoid. HOME CARE INSTRUCTIONS   Increase fiber in your diet. Ask your caregiver about using fiber supplements.  Drink enough water and fluids to keep your urine clear or pale yellow.  Exercise regularly.  Go to the bathroom when you have the urge to have a bowel movement. Do not wait.  Avoid straining to have bowel movements.  Keep the anal area dry and clean.  Only take over-the-counter or prescription medicines for pain, discomfort, or fever as  directed by your caregiver. If your hemorrhoids are thrombosed:  Take warm sitz baths for 20 to 30 minutes, 3 to 4 times per day.  If the hemorrhoids are very tender and swollen, place ice packs on the area as tolerated. Using ice packs between sitz baths may be helpful. Fill a plastic bag with ice. Place a towel between the bag of ice and your skin.  Medicated creams and suppositories may be used or applied as directed.  Do not use a donut-shaped pillow or sit on the toilet for long periods. This increases blood pooling and pain. SEEK MEDICAL CARE IF:   You have increasing pain and swelling that is not controlled with your medicine.  You have uncontrolled bleeding.  You have difficulty or you are unable to have a bowel movement.  You have pain or inflammation outside the area of the hemorrhoids.  You have chills or an oral temperature above 102 F (38.9 C). MAKE SURE YOU:   Understand these instructions.  Will watch your condition.  Will get help right away if you are not doing well or get worse. Document Released: 07/20/2000 Document Revised: 10/15/2011 Document Reviewed: 07/03/2010 [facility] Patient Information 2013 [place], [place].  Please apply 1 suppository at bedtime and retain for 1-3 hours to better effect. Use treatment for about 3-4 weeks.

## 2012-09-12 ENCOUNTER — Ambulatory Visit (INDEPENDENT_AMBULATORY_CARE_PROVIDER_SITE_OTHER): Payer: Medicare Other | Admitting: Family Medicine

## 2012-09-12 ENCOUNTER — Encounter: Payer: Self-pay | Admitting: Family Medicine

## 2012-09-12 VITALS — BP 156/82 | HR 64 | Ht 72.0 in | Wt 167.0 lb

## 2012-09-12 DIAGNOSIS — M1990 Unspecified osteoarthritis, unspecified site: Secondary | ICD-10-CM

## 2012-09-12 NOTE — Progress Notes (Signed)
  Subjective:    Patient ID: Brian Morgan, male    DOB: [birth year], 67 y.o.   MRN: [id number]  HPI Left knee pain. Had a corticosteroid injection about 6 or 7 months ago that lasted up until about 4 weeks ago. Aching pain mostly at the medial front part of his knee. Worse with standing or climbing stairs. No locking or giving way. He would like to put off any type of surgery for as long as he can. #2. Low back pain. He saw the neurosurgeon who is giving him some injection therapy in the back. It has significantly helped.   Review of Systems Denies fever, sweats, chills.    Objective:   Physical Exam Vital signs are reviewed GENERAL: Well-developed male no acute distress KNEE: Left. Synovial hypertrophy. Crepitus. Lacks full extension by 5. Has full flexion. Calf is soft. Popliteal space is benign. Ligamentously intact to varus and valgus stress. Skin around the knee is benign without sign of rash.  INJECTION: Patient was given informed consent, signed copy in the chart. Appropriate time out was taken. Area prepped and draped in usual sterile fashion. One cc of methylprednisolone 40 mg/ml plus  4 cc of 1% lidocaine without epinephrine was injected into the Left knee using a(n) Anterior medial approach. The patient tolerated the procedure well. There were no complications. Post procedure instructions were given.        Assessment & Plan:  DJD left knee. Corticosteroid injection today. He has not had recent films so if at some point these injections quit working, and they're working quite well now the last one work for about 6 months, it would probably need to repeat some films.

## 2012-09-16 ENCOUNTER — Ambulatory Visit: Payer: Medicaid Other | Admitting: Family Medicine

## 2012-09-29 ENCOUNTER — Ambulatory Visit (INDEPENDENT_AMBULATORY_CARE_PROVIDER_SITE_OTHER): Payer: Medicare Other | Admitting: Family Medicine

## 2012-09-29 ENCOUNTER — Encounter: Payer: Self-pay | Admitting: Family Medicine

## 2012-09-29 VITALS — BP 117/67 | HR 62 | Temp 98.1°F | Ht 72.0 in | Wt 169.0 lb

## 2012-09-29 DIAGNOSIS — N529 Male erectile dysfunction, unspecified: Secondary | ICD-10-CM

## 2012-09-29 MED ORDER — TADALAFIL 5 MG PO TABS: 5.0000 mg | ORAL_TABLET | ORAL | Status: DC | PRN

## 2012-09-29 NOTE — Patient Instructions (Addendum)

## 2012-09-30 ENCOUNTER — Encounter: Payer: Self-pay | Admitting: Gastroenterology

## 2012-09-30 NOTE — Assessment & Plan Note (Signed)
Prescription for Cyalis given to pt in paper per pt's request due to issues with insurance coverage and pharmacy. He has not tried this med yet, he has been on Sildenafil before with good response. Pt will f/u with Urology in a month for his Peyronie Disease.

## 2012-09-30 NOTE — Progress Notes (Signed)
Family Medicine Office Visit Note   Subjective:   Patient ID: Brian Morgan, male  DOB: Nov 02, 1945, 67 y.o.. MRN: [id number]   Pt that comes today complaining of continuing with same symptoms regarding rectal discomfort and bleeding when he wipes. He has tried the canasa suppositories and tucks with no relieve. Denies pain, stools are soft and not mixed with blood. Pt reports mild presence of mucus when wipes as well. Denies abdominal pain, bloating, heartburn, nausea or vomiting.  Review of Systems:  Pt denies SOB, chest pain, palpitations, headaches, dizziness, numbness or weakness. No changes on urinary or BM habits. No unintentional weigh loss/gain.  Objective:   Physical Exam: Gen:  NAD HEENT: Moist mucous membranes  CV: Regular rate and rhythm, no murmurs  PULM: Clear to auscultation bilaterally.  ABD: Soft, non tender, non distended, normal bowel sounds  EXT: No edema  Perianal area: Protrusion of rectal mucosa and external hemorrhoids present. No thrombosed area seen.  Assessment & Plan:

## 2012-09-30 NOTE — Assessment & Plan Note (Signed)
Treated with all conservative measurements. Blood loss is minimal. Pt hemodynamically stable.  Plan GI consult.

## 2012-10-07 ENCOUNTER — Ambulatory Visit: Payer: Medicare Other | Admitting: Gastroenterology

## 2012-10-24 ENCOUNTER — Encounter: Payer: Self-pay | Admitting: Gastroenterology

## 2012-10-24 ENCOUNTER — Ambulatory Visit (INDEPENDENT_AMBULATORY_CARE_PROVIDER_SITE_OTHER): Payer: Medicare Other | Admitting: Gastroenterology

## 2012-10-24 VITALS — BP 120/72 | HR 64 | Ht 69.25 in | Wt 171.5 lb

## 2012-10-24 DIAGNOSIS — K625 Hemorrhage of anus and rectum: Secondary | ICD-10-CM

## 2012-10-24 DIAGNOSIS — K649 Unspecified hemorrhoids: Secondary | ICD-10-CM

## 2012-10-24 MED ORDER — MOVIPREP 100 G PO SOLR: 1.0000 | Freq: Once | ORAL | Status: DC

## 2012-10-24 MED ORDER — PRAMOXINE-HC 1-1 % EX CREA: TOPICAL_CREAM | CUTANEOUS | Status: DC

## 2012-10-24 NOTE — Patient Instructions (Addendum)
Analpram ointment, use twice daily. Stop the canasa suppository. Referral to general surgeon for hemorrhoidal removal. You will be set up for a colonoscopy for rectal bleeding (LEC moderate sedation).                                               We are excited to introduce MyChart, a new best-in-class service that provides you online access to important information in your electronic medical record. We want to make it easier for you to view your health information - all in one secure location - when and where you need it. We expect MyChart will enhance the quality of care and service we provide.  When you register for MyChart, you can:    View your test results.    Request appointments and receive appointment reminders via email.    Request medication renewals.    View your medical history, allergies, medications and immunizations.    Communicate with your physician's office through a password-protected site.    Conveniently print information such as your medication lists.  To find out if MyChart is right for you, please talk to a member of our clinical staff today. We will gladly answer your questions about this free health and wellness tool.  If you are age 66 or older and want a member of your family to have access to your record, you must provide written consent by completing a proxy form available at our office. Please speak to our clinical staff about guidelines regarding accounts for patients younger than age 66.  As you activate your MyChart account and need any technical assistance, please call the MyChart technical support line at [phone] [phone]) or email your question to [email or web address]. If you email your question(s), please include your name, a return phone number and the best time to reach you.  If you have non-urgent health-related questions, you can send a message to our office through MyChart at [email or web address]. If you have a medical  emergency, call 911.  Thank you for using MyChart as your new health and wellness resource!   MyChart licensed from [employer],  4540-9811. Patents Pending.
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
## 2012-10-27 ENCOUNTER — Telehealth: Payer: Self-pay

## 2012-10-27 NOTE — Telephone Encounter (Signed)
Message copied by Donata Duff on Mon Oct 27, 2012  9:12 AM ------      Message from: Marnette Burgess      Created: Mon Oct 27, 2012  9:01 AM       Patient is scheduled to see Dr. Harriette Bouillon on 11/03/12 @ 1:50pm, arrive @ 1:20pm.  If you have any questions please call [phone].            Thank You,      Elane Fritz      ----- Message -----         From: Donata Duff, CMA         Sent: 10/24/2012   3:34 PM           To: Marnette Burgess            Pt needs appt to discuss hemorrhoid removal       ------

## 2012-10-27 NOTE — Telephone Encounter (Signed)
Blanca to notify pt  

## 2012-10-30 ENCOUNTER — Telehealth: Payer: Self-pay | Admitting: Gastroenterology

## 2012-10-30 NOTE — Telephone Encounter (Signed)
Message copied by Arna Snipe on Thu Oct 30, 2012  4:04 PM ------      Message from: Donata Duff      Created: Tue Oct 07, 2012  2:59 PM                   ----- Message -----         From: Donata Duff, CMA         Sent: 10/07/2012   2:58 PM           To: Donata Duff, CMA            Do not bill ------

## 2012-11-03 ENCOUNTER — Ambulatory Visit (AMBULATORY_SURGERY_CENTER): Payer: Medicare Other | Admitting: Gastroenterology

## 2012-11-03 ENCOUNTER — Ambulatory Visit (INDEPENDENT_AMBULATORY_CARE_PROVIDER_SITE_OTHER): Payer: Medicare Other | Admitting: Surgery

## 2012-11-03 ENCOUNTER — Encounter: Payer: Self-pay | Admitting: Gastroenterology

## 2012-11-03 VITALS — BP 129/77 | HR 50 | Temp 96.9°F | Resp 21 | Ht 69.25 in | Wt 171.0 lb

## 2012-11-03 DIAGNOSIS — K648 Other hemorrhoids: Secondary | ICD-10-CM

## 2012-11-03 DIAGNOSIS — D126 Benign neoplasm of colon, unspecified: Secondary | ICD-10-CM

## 2012-11-03 DIAGNOSIS — K625 Hemorrhage of anus and rectum: Secondary | ICD-10-CM

## 2012-11-03 MED ORDER — SODIUM CHLORIDE 0.9 % IV SOLN: 500.0000 mL | INTRAVENOUS | Status: DC

## 2012-11-03 NOTE — Progress Notes (Signed)
No complaints noted in the recovery room. Maw   

## 2012-11-03 NOTE — Progress Notes (Signed)
Pt's wife said the transport service should be waiting at the front for them.  I asked her to please call and ask if the service was out front.  They told the pt's wife it would be about 10 more minutes.  The pt and his wife are waiting in the conference room until the service calls and said they are at the front door. Maw

## 2012-11-03 NOTE — Progress Notes (Signed)
Patient did not experience any of the following events: a burn prior to discharge; a fall within the facility; wrong site/side/patient/procedure/implant event; or a hospital transfer or hospital admission upon discharge from the facility. (G8907) Patient did not have preoperative order for IV antibiotic SSI prophylaxis. (G8918)  

## 2012-11-03 NOTE — Op Note (Signed)
[facility] [address] [place], [address]   COLONOSCOPY PROCEDURE REPORT  PATIENT: Brian Morgan, Brian Morgan  MR#: [id number] BIRTHDATE: 1946/05/23 , 66  yrs. old GENDER: Male ENDOSCOPIST: Rachael Fee, MD REFERRED BY: PROCEDURE DATE:  11/03/2012 PROCEDURE:   Colonoscopy with snare polypectomy and Colonoscopy with biopsy ASA CLASS:   Class II INDICATIONS:intermittent rectal bleeding, ?hemorrhoid. MEDICATIONS: Fentanyl 50 mcg IV, Versed 5 mg IV, and These medications were titrated to patient response per physician's verbal order DESCRIPTION OF PROCEDURE:   After the risks benefits and alternatives of the procedure were thoroughly explained, informed consent was obtained.  A digital rectal exam revealed internal hemorrhoids.   The LB [id number]  endoscope was introduced through the anus and advanced to the cecum, which was identified by both the appendix and ileocecal valve. No adverse events experienced.   The quality of the prep was good.  The instrument was then slowly withdrawn as the colon was fully examined.  COLON FINDINGS: Two polyps were found, removed and sent to pathology.  One was sessile, located in cecum, 1-1mm across, removed with biopsy forceps.  The other was penduculated, 2cm across, located in sigmoid, removed with snare/cautery.  Adjacent to the dentate line there was a polypoid lesion that is either medium sized, inflammed hemorrhoid or a inflammed polyp.  I favor hemorrhoid.  The examination was otherwise normal.  Retroflexed views revealed no abnormalities. The time to cecum=4 minutes 07 seconds.  Withdrawal time=9 minutes 50 seconds.  The scope was withdrawn and the procedure completed. COMPLICATIONS: There were no complications.  ENDOSCOPIC IMPRESSION: Two polyps were found, removed and sent to pathology. Adjacent to the dentate line there was a polypoid lesion that is either medium sized, inflammed hemorrhoid or a inflammed  polyp.  I favor hemorrhoid. The examination was otherwise normal.  RECOMMENDATIONS: If the polyp(s) removed today are proven to be adenomatous (pre-cancerous) polyps, you will need a colonoscopy in 3 years. Otherwise you should continue to follow colorectal cancer screening guidelines for "routine risk" patients with a colonoscopy in 10 years.  You will receive a letter within 1-2 weeks with the results of your biopsy as well as final recommendations.  Please call my office if you have not received a letter after 3 weeks. My office will again contact him about referral to general surgery regarding hemorrhoid. This may be a medium sized, imflamed polyp however at this location (involving dentate line) I favor transanal, surgical removal if it is indeed a polyp. eSigned:  Rachael Fee, MD 11/03/2012 4:04 PM

## 2012-11-03 NOTE — Patient Instructions (Addendum)
Discharge instructions given with verbal understanding. Handout on polyps given. Surgical consult. Office will call. resume previous medications. YOU HAD AN ENDOSCOPIC PROCEDURE TODAY AT THE [facility]: Refer to the procedure report that was given to you for any specific questions about what was found during the examination.  If the procedure report does not answer your questions, please call your gastroenterologist to clarify.  If you requested that your care partner not be given the details of your procedure findings, then the procedure report has been included in a sealed envelope for you to review at your convenience later.  YOU SHOULD EXPECT: Some feelings of bloating in the abdomen. Passage of more gas than usual.  Walking can help get rid of the air that was put into your GI tract during the procedure and reduce the bloating. If you had a lower endoscopy (such as a colonoscopy or flexible sigmoidoscopy) you may notice spotting of blood in your stool or on the toilet paper. If you underwent a bowel prep for your procedure, then you may not have a normal bowel movement for a few days.  DIET: Your first meal following the procedure should be a light meal and then it is ok to progress to your normal diet.  A half-sandwich or bowl of soup is an example of a good first meal.  Heavy or fried foods are harder to digest and may make you feel nauseous or bloated.  Likewise meals heavy in dairy and vegetables can cause extra gas to form and this can also increase the bloating.  Drink plenty of fluids but you should avoid alcoholic beverages for 24 hours.  ACTIVITY: Your care partner should take you home directly after the procedure.  You should plan to take it easy, moving slowly for the rest of the day.  You can resume normal activity the day after the procedure however you should NOT DRIVE or use heavy machinery for 24 hours (because of the sedation medicines used during the test).     SYMPTOMS TO REPORT IMMEDIATELY: A gastroenterologist can be reached at any hour.  During normal business hours, 8:30 AM to 5:00 PM Monday through Friday, call [phone].  After hours and on weekends, please call the GI answering service at [phone] who will take a message and have the physician on call contact you.   Following lower endoscopy (colonoscopy or flexible sigmoidoscopy):  Excessive amounts of blood in the stool  Significant tenderness or worsening of abdominal pains  Swelling of the abdomen that is new, acute  Fever of 100F or higher FOLLOW UP: If any biopsies were taken you will be contacted by phone or by letter within the next 1-3 weeks.  Call your gastroenterologist if you have not heard about the biopsies in 3 weeks.  Our staff will call the home number listed on your records the next business day following your procedure to check on you and address any questions or concerns that you may have at that time regarding the information given to you following your procedure. This is a courtesy call and so if there is no answer at the home number and we have not heard from you through the emergency physician on call, we will assume that you have returned to your regular daily activities without incident.  SIGNATURES/CONFIDENTIALITY: You and/or your care partner have signed paperwork which will be entered into your electronic medical record.  These signatures attest to the fact that that the information above on your  After Visit Summary has been reviewed and is understood.  Full responsibility of the confidentiality of this discharge information lies with you and/or your care-partner.

## 2012-11-04 ENCOUNTER — Telehealth: Payer: Self-pay | Admitting: *Deleted

## 2012-11-04 NOTE — Telephone Encounter (Signed)
  Follow up Call-  Call back number 11/03/2012  Post procedure Call Back phone  # [phone]  Permission to leave phone message Yes     Patient questions:  Do you have a fever, pain , or abdominal swelling? no Pain Score  0 *  Have you tolerated food without any problems? yes  Have you been able to return to your normal activities? yes  Do you have any questions about your discharge instructions: Diet   no Medications  no Follow up visit  no  Do you have questions or concerns about your Care? no  Actions: * If pain score is 4 or above: No action needed, pain <4.

## 2012-11-05 ENCOUNTER — Other Ambulatory Visit: Payer: Self-pay | Admitting: *Deleted

## 2012-11-05 ENCOUNTER — Telehealth: Payer: Self-pay | Admitting: Family Medicine

## 2012-11-05 ENCOUNTER — Telehealth: Payer: Self-pay | Admitting: Gastroenterology

## 2012-11-05 MED ORDER — GABAPENTIN 300 MG PO CAPS: 300.0000 mg | ORAL_CAPSULE | Freq: Two times a day (BID) | ORAL | Status: DC

## 2012-11-05 MED ORDER — PRAMOXINE-HC 1-1 % EX CREA: TOPICAL_CREAM | CUTANEOUS | Status: DC

## 2012-11-05 MED ORDER — TRAMADOL HCL 50 MG PO TABS: 50.0000 mg | ORAL_TABLET | Freq: Four times a day (QID) | ORAL | Status: DC | PRN

## 2012-11-05 NOTE — Telephone Encounter (Signed)
rx has been sent per pt request 

## 2012-11-05 NOTE — Telephone Encounter (Signed)
Pt states that [facility] closed and that his meds were transferred [facility] to [facility] [address] He called them and they told him they don't have any record of it.  Wants to know if all him meds can be sent [facility]

## 2012-11-10 ENCOUNTER — Encounter (INDEPENDENT_AMBULATORY_CARE_PROVIDER_SITE_OTHER): Payer: Self-pay | Admitting: Surgery

## 2012-11-10 ENCOUNTER — Encounter: Payer: Self-pay | Admitting: Gastroenterology

## 2012-11-10 ENCOUNTER — Ambulatory Visit (INDEPENDENT_AMBULATORY_CARE_PROVIDER_SITE_OTHER): Payer: Medicare Other | Admitting: Surgery

## 2012-11-10 VITALS — BP 122/80 | HR 69 | Temp 97.4°F | Ht 72.0 in | Wt 172.4 lb

## 2012-11-10 DIAGNOSIS — K649 Unspecified hemorrhoids: Secondary | ICD-10-CM
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
## 2012-11-10 NOTE — Patient Instructions (Signed)
Hemorrhoidectomy Hemorrhoidectomy is surgery to remove hemorrhoids. Hemorrhoids are veins that have become swollen in the rectum. The rectum is the area from the bottom end of the intestines to the opening where bowel movements leave the body. Hemorrhoids can be uncomfortable. They can cause itching, bleeding and pain if a blood clot forms in them (thrombose). If hemorrhoids are small, surgery may not be needed. But if they cover a larger area, surgery is usually suggested.  LET YOUR CAREGIVER KNOW ABOUT:   Any allergies.  All medications you are taking, including:  Herbs, eyedrops, over-the-counter medications and creams.  Blood thinners (anticoagulants), aspirin or other drugs that could affect blood clotting.  Use of steroids (by mouth or as creams).  Previous problems with anesthetics, including local anesthetics.  Possibility of pregnancy, if this applies.  Any history of blood clots.  Any history of bleeding or other blood problems.  Previous surgery.  Smoking history.  Other health problems. RISKS AND COMPLICATIONS All surgery carries some risk. However, hemorrhoid surgery usually goes smoothly. Possible complications could include:  Urinary retention.  Bleeding.  Infection.  A painful incision.  A reaction to the anesthesia (this is not common). BEFORE THE PROCEDURE   Stop using aspirin and non-steroidal anti-inflammatory drugs (NSAIDs) for pain relief. This includes prescription drugs and over-the-counter drugs such as ibuprofen and naproxen. Also stop taking vitamin E. If possible, do this two weeks before your surgery.  If you take blood-thinners, ask your healthcare provider when you should stop taking them.  You will probably have blood and urine tests done several days before your surgery.  Do not eat or drink for about 8 hours before the surgery.  Arrive at least an hour before the surgery, or whenever your surgeon recommends. This will give you time to  check in and fill out any needed paperwork.  Hemorrhoidectomy is often an outpatient procedure. This means you will be able to go home the same day. Sometimes, though, people stay overnight in the hospital after the procedure. Ask your surgeon what to expect. Either way, make arrangements in advance for someone to drive you home. PROCEDURE   The preparation:  You will change into a hospital gown.  You will be given an IV. A needle will be inserted in your arm. Medication can flow directly into your body through this needle.  You might be given an enema to clear your rectum.  Once in the operating room, you will probably lie on your side or be repositioned later to lying on your stomach.  You will be given anesthesia (medication) so you will not feel anything during the surgery. The surgery often is done with local anesthesia (the area near the hemorrhoids will be numb and you will be drowsy but awake). Sometimes, general anesthesia is used (you will be asleep during the procedure).  The procedure:  There are a few different procedures for hemorrhoids. Be sure to ask you surgeon about the procedure, the risks and benefits.  Be sure to ask about what you need to do to take care of the wound, if there is one. AFTER THE PROCEDURE  You will stay in a recovery area until the anesthesia has worn off. Your blood pressure and pulse will be checked every so often.  You may feel a lot of pain in the area of the rectum.  Take all pain medication prescribed by your surgeon. Ask before taking any over-the-counter pain medicines.  Sometimes sitting in a warm bath can help relieve   your pain.  To make sure you have bowel movements without straining:  You will probably need to take stool softeners (usually a pill) for a few days.  You should drink 8 to 10 glasses of water each day.  Your activity will be restricted for awhile. Ask your caregiver for a list of what you should and should not do  while you recover. Document Released: 05/20/2009 Document Revised: 10/15/2011 Document Reviewed: 05/20/2009 Exit[employer] Patient Information 2013 [employer].  

## 2012-11-12 ENCOUNTER — Other Ambulatory Visit: Payer: Self-pay | Admitting: Family Medicine

## 2012-11-12 DIAGNOSIS — I10 Essential (primary) hypertension: Secondary | ICD-10-CM

## 2012-11-12 DIAGNOSIS — N529 Male erectile dysfunction, unspecified: Secondary | ICD-10-CM

## 2012-11-12 MED ORDER — LISINOPRIL 20 MG PO TABS: 20.0000 mg | ORAL_TABLET | Freq: Every day | ORAL | Status: DC

## 2012-11-12 MED ORDER — HYDROCHLOROTHIAZIDE 12.5 MG PO TABS
12.5000 mg | ORAL_TABLET | Freq: Every day | ORAL | Status: DC
Start: 2012-11-12 — End: 2012-11-21

## 2012-11-12 MED ORDER — TADALAFIL 5 MG PO TABS: 5.0000 mg | ORAL_TABLET | ORAL | Status: DC | PRN

## 2012-11-12 NOTE — Telephone Encounter (Signed)
Medications refills sent to [facility] per pt's request.

## 2012-11-13 NOTE — Telephone Encounter (Signed)
Spoke with patient and informed her of below 

## 2012-11-21 ENCOUNTER — Encounter (HOSPITAL_COMMUNITY): Payer: Self-pay | Admitting: Pharmacy Technician

## 2012-11-24 ENCOUNTER — Encounter (HOSPITAL_COMMUNITY)
Admission: RE | Admit: 2012-11-24 | Discharge: 2012-11-24 | Disposition: A | Discharge status: Home / Self Care | Payer: Medicare Other | Source: Ambulatory Visit | Attending: Surgery | Admitting: Surgery

## 2012-11-24 ENCOUNTER — Encounter (HOSPITAL_COMMUNITY): Payer: Self-pay

## 2012-11-24 ENCOUNTER — Ambulatory Visit (HOSPITAL_COMMUNITY)
Admission: RE | Admit: 2012-11-24 | Discharge: 2012-11-24 | Disposition: A | Discharge status: Home / Self Care | Payer: Medicare Other | Source: Ambulatory Visit | Attending: Surgery | Admitting: Surgery

## 2012-11-24 DIAGNOSIS — Z01818 Encounter for other preprocedural examination: Secondary | ICD-10-CM | POA: Insufficient documentation

## 2012-11-24 DIAGNOSIS — Z87891 Personal history of nicotine dependence: Secondary | ICD-10-CM | POA: Insufficient documentation

## 2012-11-24 DIAGNOSIS — Z01812 Encounter for preprocedural laboratory examination: Secondary | ICD-10-CM | POA: Insufficient documentation

## 2012-11-24 DIAGNOSIS — Z01810 Encounter for preprocedural cardiovascular examination: Secondary | ICD-10-CM | POA: Insufficient documentation

## 2012-11-24 DIAGNOSIS — K649 Unspecified hemorrhoids: Secondary | ICD-10-CM | POA: Insufficient documentation

## 2012-11-24 DIAGNOSIS — Z87442 Personal history of urinary calculi: Secondary | ICD-10-CM | POA: Insufficient documentation

## 2012-11-24 DIAGNOSIS — B1920 Unspecified viral hepatitis C without hepatic coma: Secondary | ICD-10-CM | POA: Insufficient documentation

## 2012-11-24 DIAGNOSIS — J45909 Unspecified asthma, uncomplicated: Secondary | ICD-10-CM | POA: Insufficient documentation

## 2012-11-24 DIAGNOSIS — I10 Essential (primary) hypertension: Secondary | ICD-10-CM | POA: Insufficient documentation

## 2012-11-24 HISTORY — DX: Inflammatory liver disease, unspecified: K75.9

## 2012-11-24 HISTORY — DX: Nonspecific reaction to tuberculin skin test without active tuberculosis: R76.11

## 2012-11-24 LAB — CBC WITH DIFFERENTIAL/PLATELET
Hemoglobin: 14.7 g/dL (ref 13.0–17.0)
Lymphocytes Relative: 23 % (ref 12–46)
Lymphs Abs: 1.9 10*3/uL (ref 0.7–4.0)
MCH: 31.5 pg (ref 26.0–34.0)
Monocytes Relative: 10 % (ref 3–12)
Neutro Abs: 5.2 10*3/uL (ref 1.7–7.7)
Neutrophils Relative %: 63 % (ref 43–77)
RBC: 4.66 MIL/uL (ref 4.22–5.81)
WBC: 8.2 10*3/uL (ref 4.0–10.5)

## 2012-11-24 LAB — COMPREHENSIVE METABOLIC PANEL
ALT: 15 U/L (ref 0–53)
Alkaline Phosphatase: 56 U/L (ref 39–117)
BUN: 14 mg/dL (ref 6–23)
CO2: 30 mEq/L (ref 19–32)
Chloride: 105 mEq/L (ref 96–112)
GFR calc Af Amer: >90 mL/min (ref >90)
Glucose, Bld: 83 mg/dL (ref 70–99)
Potassium: 3.7 mEq/L (ref 3.5–5.1)
Sodium: 143 mEq/L (ref 135–145)
Total Bilirubin: 0.4 mg/dL (ref 0.3–1.2)

## 2012-11-24 LAB — NO BLOOD PRODUCTS

## 2012-11-24 LAB — SURGICAL PCR SCREEN: MRSA, PCR: NEGATIVE

## 2012-11-24 NOTE — Progress Notes (Signed)
Last EKG - at [place], [place], at correctional facility [address]

## 2012-11-24 NOTE — Pre-Procedure Instructions (Addendum)
[name]  11/24/2012   Your procedure is scheduled on:  12/03/2012  Report to [facility] at 08:20 AM.  Call this number if you have problems the morning of surgery: [phone]   Remember: DON'T take any antiinflammatories, aspirin, for 1 week prior to surgery    Do not eat food or drink liquids after midnight. TUESDAY   Take these medicines the morning of surgery with A SIP OF WATER: NOTHING   Do not wear jewelry  Do not wear lotions, powders, or perfumes. You may wear deodorant.   Men may shave face and neck.  Do not bring valuables to the hospital.  Contacts, dentures or bridgework may not be worn into surgery.  Leave suitcase in the car. After surgery it may be brought to your room.  For patients admitted to the hospital, checkout time is 11:00 AM the day of  discharge.   Patients discharged the day of surgery will not be allowed to drive  home.  Name and phone number of your driver: /w Ms. Earl Gala  Special Instructions: Shower using CHG 2 nights before surgery and the night before surgery.  If you shower the day of surgery use CHG.  Use special wash - you have one bottle of CHG for all showers.  You should use approximately 1/3 of the bottle for each shower.   Please read over the following fact sheets that you were given: Pain Booklet, Coughing and Deep Breathing, MRSA Information and Surgical Site Infection Prevention

## 2012-11-24 NOTE — Progress Notes (Signed)
Call to [facility] to inquire about a previous ekg, left voicemail to send to PAT [facility], if available .

## 2012-11-24 NOTE — Progress Notes (Signed)
Uses electronic cigarette, daily, pharmaceutical grade - veggie use

## 2012-11-25 ENCOUNTER — Encounter (HOSPITAL_COMMUNITY): Payer: Self-pay

## 2012-11-25 NOTE — Progress Notes (Signed)
Anesthesia Chart Review:  Patient is a 67 year old male scheduled for hemorrhoidectomy by Dr. Luisa Hart on 12/03/12.  History includes former smoker (now using e-cig), Hepatitis C, polysubstance abuse (quit in 2001), nephrolithiasis, HTN, asthma, sciatica, arthritis, + PPD in 1970's s/p treatment, tonsillectomy.  He has been receiving primary care services thru [facility], last with Dr. Osie Bond.  EKG on 11/24/12 showed SB @ 59 bpm, cannot rule out anterior infarct (age undetermined).  Borderline LAD.  Currently, there are no comparison EKGs available.  (Previous EKG was at a correctional facility in [place], [place].)  No CV symptoms were documented at his PAT visit.  He has a history of HTN, but no known CAD/MI/CHF or DM.  He had a normal CXR on 11/24/12.  Preoperative labs noted.  CBC is WNL. AST/ALT WNL.  He has requested no blood transfusion.    Patient will be evaluated by his assigned anesthesiologist on the day of surgery.  If he remains asymptomatic from a CV standpoint then would anticipate he could proceed as planned.  Velna Ochs [facility]/Anesthesiology Phone [phone] 11/25/2012 2:39 PM

## 2012-12-02 MED ORDER — CIPROFLOXACIN IN D5W 400 MG/200ML IV SOLN
400.0000 mg | INTRAVENOUS | Status: AC
  Administered 2012-12-03: 400 mg via INTRAVENOUS
  Filled 2012-12-02: qty 200

## 2012-12-02 MED ORDER — CHLORHEXIDINE GLUCONATE 4 % EX LIQD: 1.0000 "application " | Freq: Once | CUTANEOUS | Status: DC

## 2012-12-03 ENCOUNTER — Ambulatory Visit (HOSPITAL_COMMUNITY)
Admission: RE | Admit: 2012-12-03 | Discharge: 2012-12-03 | Disposition: A | Discharge status: Home / Self Care | Payer: Medicare Other | Source: Ambulatory Visit | Attending: Surgery | Admitting: Surgery

## 2012-12-03 ENCOUNTER — Encounter (HOSPITAL_COMMUNITY): Payer: Self-pay | Admitting: *Deleted

## 2012-12-03 ENCOUNTER — Ambulatory Visit (HOSPITAL_COMMUNITY): Payer: Medicare Other | Admitting: Anesthesiology

## 2012-12-03 ENCOUNTER — Encounter (HOSPITAL_COMMUNITY): Payer: Self-pay | Admitting: Vascular Surgery

## 2012-12-03 ENCOUNTER — Encounter (HOSPITAL_COMMUNITY)
Admission: RE | Disposition: A | Discharge status: Home / Self Care | Payer: Self-pay | Source: Ambulatory Visit | Attending: Surgery

## 2012-12-03 DIAGNOSIS — M1990 Unspecified osteoarthritis, unspecified site: Secondary | ICD-10-CM

## 2012-12-03 DIAGNOSIS — M549 Dorsalgia, unspecified: Secondary | ICD-10-CM

## 2012-12-03 DIAGNOSIS — N486 Induration penis plastica: Secondary | ICD-10-CM

## 2012-12-03 DIAGNOSIS — Z87442 Personal history of urinary calculi: Secondary | ICD-10-CM | POA: Insufficient documentation

## 2012-12-03 DIAGNOSIS — M5430 Sciatica, unspecified side: Secondary | ICD-10-CM

## 2012-12-03 DIAGNOSIS — R7611 Nonspecific reaction to tuberculin skin test without active tuberculosis: Secondary | ICD-10-CM

## 2012-12-03 DIAGNOSIS — Z87891 Personal history of nicotine dependence: Secondary | ICD-10-CM | POA: Insufficient documentation

## 2012-12-03 DIAGNOSIS — M79641 Pain in right hand: Secondary | ICD-10-CM

## 2012-12-03 DIAGNOSIS — N529 Male erectile dysfunction, unspecified: Secondary | ICD-10-CM

## 2012-12-03 DIAGNOSIS — K649 Unspecified hemorrhoids: Secondary | ICD-10-CM

## 2012-12-03 DIAGNOSIS — J45909 Unspecified asthma, uncomplicated: Secondary | ICD-10-CM | POA: Insufficient documentation

## 2012-12-03 DIAGNOSIS — Z299 Encounter for prophylactic measures, unspecified: Secondary | ICD-10-CM

## 2012-12-03 DIAGNOSIS — K644 Residual hemorrhoidal skin tags: Secondary | ICD-10-CM | POA: Insufficient documentation

## 2012-12-03 DIAGNOSIS — G5603 Carpal tunnel syndrome, bilateral upper limbs: Secondary | ICD-10-CM

## 2012-12-03 DIAGNOSIS — K625 Hemorrhage of anus and rectum: Secondary | ICD-10-CM

## 2012-12-03 DIAGNOSIS — B1920 Unspecified viral hepatitis C without hepatic coma: Secondary | ICD-10-CM

## 2012-12-03 DIAGNOSIS — L821 Other seborrheic keratosis: Secondary | ICD-10-CM

## 2012-12-03 DIAGNOSIS — K648 Other hemorrhoids: Secondary | ICD-10-CM

## 2012-12-03 DIAGNOSIS — G8929 Other chronic pain: Secondary | ICD-10-CM

## 2012-12-03 DIAGNOSIS — R972 Elevated prostate specific antigen [PSA]: Secondary | ICD-10-CM

## 2012-12-03 DIAGNOSIS — M129 Arthropathy, unspecified: Secondary | ICD-10-CM | POA: Insufficient documentation

## 2012-12-03 DIAGNOSIS — I10 Essential (primary) hypertension: Secondary | ICD-10-CM

## 2012-12-03 HISTORY — PX: HEMORRHOID SURGERY: SHX153

## 2012-12-03 SURGERY — HEMORRHOIDECTOMY
Anesthesia: General | Site: Rectum | Wound class: Clean Contaminated

## 2012-12-03 MED ORDER — MIDAZOLAM HCL 5 MG/5ML IJ SOLN
INTRAMUSCULAR | Status: DC | PRN
  Administered 2012-12-03 (×2): 1 mg via INTRAVENOUS

## 2012-12-03 MED ORDER — ONDANSETRON HCL 4 MG/2ML IJ SOLN
INTRAMUSCULAR | Status: DC | PRN
  Administered 2012-12-03: 4 mg via INTRAVENOUS

## 2012-12-03 MED ORDER — LIDOCAINE HCL 2 % EX GEL
CUTANEOUS | Status: AC
  Filled 2012-12-03: qty 20

## 2012-12-03 MED ORDER — POLYETHYLENE GLYCOL 3350 17 GM/SCOOP PO POWD: 17.0000 g | Freq: Every day | ORAL | Status: DC

## 2012-12-03 MED ORDER — PROPOFOL 10 MG/ML IV BOLUS
INTRAVENOUS | Status: DC | PRN
  Administered 2012-12-03: 200 mg via INTRAVENOUS

## 2012-12-03 MED ORDER — FENTANYL CITRATE 0.05 MG/ML IJ SOLN
INTRAMUSCULAR | Status: DC | PRN
  Administered 2012-12-03: 75 ug via INTRAVENOUS
  Administered 2012-12-03: 50 ug via INTRAVENOUS
  Administered 2012-12-03: 75 ug via INTRAVENOUS

## 2012-12-03 MED ORDER — ONDANSETRON HCL 4 MG/2ML IJ SOLN: INTRAMUSCULAR | Status: DC | PRN

## 2012-12-03 MED ORDER — LIDOCAINE HCL (CARDIAC) 20 MG/ML IV SOLN
INTRAVENOUS | Status: DC | PRN
  Administered 2012-12-03: 60 mg via INTRAVENOUS

## 2012-12-03 MED ORDER — OXYCODONE-ACETAMINOPHEN 5-325 MG PO TABS
ORAL_TABLET | ORAL | Status: AC
  Filled 2012-12-03: qty 2

## 2012-12-03 MED ORDER — ARTIFICIAL TEARS OP OINT
TOPICAL_OINTMENT | OPHTHALMIC | Status: DC | PRN
  Administered 2012-12-03: 1 via OPHTHALMIC

## 2012-12-03 MED ORDER — BUPIVACAINE LIPOSOME 1.3 % IJ SUSP
INTRAMUSCULAR | Status: DC | PRN
  Administered 2012-12-03: 30 mL

## 2012-12-03 MED ORDER — HYDROMORPHONE HCL PF 1 MG/ML IJ SOLN
0.2500 mg | INTRAMUSCULAR | Status: DC | PRN
  Administered 2012-12-03: 0.5 mg via INTRAVENOUS

## 2012-12-03 MED ORDER — ONDANSETRON HCL 4 MG/2ML IJ SOLN
INTRAMUSCULAR | Status: AC
  Administered 2012-12-03: 4 mg via INTRAVENOUS
  Filled 2012-12-03: qty 2

## 2012-12-03 MED ORDER — HYDROMORPHONE HCL PF 1 MG/ML IJ SOLN
INTRAMUSCULAR | Status: AC
  Administered 2012-12-03: 0.5 mg via INTRAVENOUS
  Filled 2012-12-03: qty 1

## 2012-12-03 MED ORDER — SODIUM CHLORIDE 0.9 % IJ SOLN
INTRAMUSCULAR | Status: DC | PRN
  Administered 2012-12-03: 30 mL

## 2012-12-03 MED ORDER — 0.9 % SODIUM CHLORIDE (POUR BTL) OPTIME
TOPICAL | Status: DC | PRN
  Administered 2012-12-03: 1000 mL

## 2012-12-03 MED ORDER — OXYCODONE-ACETAMINOPHEN 5-325 MG PO TABS: 2.0000 | ORAL_TABLET | Freq: Four times a day (QID) | ORAL | Status: DC | PRN

## 2012-12-03 MED ORDER — ONDANSETRON HCL 4 MG/2ML IJ SOLN: 4.0000 mg | Freq: Once | INTRAMUSCULAR | Status: AC | PRN

## 2012-12-03 MED ORDER — EPHEDRINE SULFATE 50 MG/ML IJ SOLN
INTRAMUSCULAR | Status: DC | PRN
  Administered 2012-12-03: 5 mg via INTRAVENOUS
  Administered 2012-12-03: 15 mg via INTRAVENOUS

## 2012-12-03 MED ORDER — BUPIVACAINE LIPOSOME 1.3 % IJ SUSP
20.0000 mL | Freq: Once | INTRAMUSCULAR | Status: DC
  Filled 2012-12-03: qty 20

## 2012-12-03 MED ORDER — LACTATED RINGERS IV SOLN
INTRAVENOUS | Status: DC
  Administered 2012-12-03: 10:00:00 via INTRAVENOUS

## 2012-12-03 MED ORDER — LIDOCAINE HCL 2 % EX GEL: CUTANEOUS | Status: DC | PRN

## 2012-12-03 MED ORDER — BUPIVACAINE-EPINEPHRINE PF 0.25-1:200000 % IJ SOLN
INTRAMUSCULAR | Status: AC
  Filled 2012-12-03: qty 30

## 2012-12-03 MED ORDER — LACTATED RINGERS IV SOLN
INTRAVENOUS | Status: DC | PRN
  Administered 2012-12-03: 10:00:00 via INTRAVENOUS

## 2012-12-03 SURGICAL SUPPLY — 39 items
BLADE SURG 15 STRL LF DISP TIS (BLADE) ×1 IMPLANT
BLADE SURG 15 STRL SS (BLADE) ×2
CANISTER SUCTION 2500CC (MISCELLANEOUS) ×2 IMPLANT
CLOTH BEACON ORANGE TIMEOUT ST (SAFETY) ×2 IMPLANT
COVER SURGICAL LIGHT HANDLE (MISCELLANEOUS) ×2 IMPLANT
DRAPE PROXIMA HALF (DRAPES) ×2 IMPLANT
DRAPE UTILITY 15X26 W/TAPE STR (DRAPE) ×4 IMPLANT
DRSG PAD ABDOMINAL 8X10 ST (GAUZE/BANDAGES/DRESSINGS) ×2 IMPLANT
ELECT CAUTERY BLADE 6.4 (BLADE) ×2 IMPLANT
ELECT REM PT RETURN 9FT ADLT (ELECTROSURGICAL) ×2
ELECTRODE REM PT RTRN 9FT ADLT (ELECTROSURGICAL) ×1 IMPLANT
GAUZE SPONGE 4X4 16PLY XRAY LF (GAUZE/BANDAGES/DRESSINGS) ×2 IMPLANT
GLOVE BIO SURGEON STRL SZ8 (GLOVE) ×3 IMPLANT
GLOVE BIOGEL PI IND STRL 8 (GLOVE) ×1 IMPLANT
GLOVE BIOGEL PI INDICATOR 8 (GLOVE) ×2
GLOVE SURG SS PI 7.5 STRL IVOR (GLOVE) ×1 IMPLANT
GOWN STRL NON-REIN LRG LVL3 (GOWN DISPOSABLE) ×2 IMPLANT
GOWN STRL REIN XL XLG (GOWN DISPOSABLE) ×2 IMPLANT
HEMOSTAT SURGICEL 2X14 (HEMOSTASIS) ×1 IMPLANT
KIT BASIN OR (CUSTOM PROCEDURE TRAY) ×2 IMPLANT
KIT ROOM TURNOVER OR (KITS) ×2 IMPLANT
MARKER SKIN DUAL TIP RULER LAB (MISCELLANEOUS) ×1 IMPLANT
NDL HYPO 25GX1X1/2 BEV (NEEDLE) ×1 IMPLANT
NEEDLE HYPO 25GX1X1/2 BEV (NEEDLE) ×2 IMPLANT
NS IRRIG 1000ML POUR BTL (IV SOLUTION) ×2 IMPLANT
PACK LITHOTOMY IV (CUSTOM PROCEDURE TRAY) ×2 IMPLANT
PENCIL BUTTON HOLSTER BLD 10FT (ELECTRODE) ×2 IMPLANT
SHEARS HARMONIC 9CM CVD (BLADE) ×2 IMPLANT
SPONGE GAUZE 4X4 12PLY (GAUZE/BANDAGES/DRESSINGS) ×2 IMPLANT
SURGILUBE 2OZ TUBE FLIPTOP (MISCELLANEOUS) ×2 IMPLANT
SUT MON AB 3-0 SH 27 (SUTURE) ×4
SUT MON AB 3-0 SH27 (SUTURE) ×1 IMPLANT
SYR BULB IRRIGATION 50ML (SYRINGE) ×1 IMPLANT
SYR CONTROL 10ML LL (SYRINGE) ×2 IMPLANT
TOWEL OR 17X24 6PK STRL BLUE (TOWEL DISPOSABLE) ×2 IMPLANT
TOWEL OR 17X26 10 PK STRL BLUE (TOWEL DISPOSABLE) ×2 IMPLANT
TUBE CONNECTING 12X1/4 (SUCTIONS) ×2 IMPLANT
UNDERPAD 30X30 INCONTINENT (UNDERPADS AND DIAPERS) ×2 IMPLANT
YANKAUER SUCT BULB TIP NO VENT (SUCTIONS) ×2 IMPLANT

## 2012-12-03 NOTE — Anesthesia Postprocedure Evaluation (Signed)
  Anesthesia Post-op Note  Patient: Brian Morgan  Procedure(s) Performed: Procedure(s): HEMORRHOIDECTOMY (N/A)  Patient Location: PACU  Anesthesia Type:General  Level of Consciousness: awake, alert , oriented and patient cooperative  Airway and Oxygen Therapy: Patient Spontanous Breathing  Post-op Pain: mild  Post-op Assessment: Post-op Vital signs reviewed, Patient's Cardiovascular Status Stable, Respiratory Function Stable, Patent Airway, No signs of Nausea or vomiting and Pain level controlled  Post-op Vital Signs: stable  Complications: No apparent anesthesia complications
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
## 2012-12-03 NOTE — Preoperative (Signed)
Beta Blockers   Reason not to administer Beta Blockers:Not Applicable 

## 2012-12-03 NOTE — Interval H&P Note (Signed)
History and Physical Interval Note:  12/03/2012 9:52 AM  Brian Morgan  has presented today for surgery, with the diagnosis of hemorrhoidectomy   The various methods of treatment have been discussed with the patient and family. After consideration of risks, benefits and other options for treatment, the patient has consented to  Procedure(s): HEMORRHOIDECTOMY (N/A) as a surgical intervention .  The patient's history has been reviewed, patient examined, no change in status, stable for surgery.  I have reviewed the patient's chart and labs.  Questions were answered to the patient's satisfaction.     Malania Gawthrop A.

## 2012-12-03 NOTE — Anesthesia Preprocedure Evaluation (Signed)
Anesthesia Evaluation  Patient identified by MRN, date of birth, ID band Patient awake    Reviewed: Allergy & Precautions, H&P , NPO status , Patient's Chart, lab work & pertinent test results  Airway Mallampati: I TM Distance: >3 FB Neck ROM: full    Dental   Pulmonary asthma ,          Cardiovascular hypertension, Rhythm:regular Rate:Normal     Neuro/Psych PSYCHIATRIC DISORDERS  Neuromuscular disease    GI/Hepatic (+) Hepatitis -, C  Endo/Other    Renal/GU Renal disease     Musculoskeletal   Abdominal   Peds  Hematology   Anesthesia Other Findings Substance abuse  Reproductive/Obstetrics                           Anesthesia Physical Anesthesia Plan  ASA: III  Anesthesia Plan: General   Post-op Pain Management:    Induction: Intravenous  Airway Management Planned: LMA  Additional Equipment:   Intra-op Plan:   Post-operative Plan: Extubation in OR  Informed Consent: I have reviewed the patients History and Physical, chart, labs and discussed the procedure including the risks, benefits and alternatives for the proposed anesthesia with the patient or authorized representative who has indicated his/her understanding and acceptance.     Plan Discussed with: CRNA, Anesthesiologist and Surgeon  Anesthesia Plan Comments:         Anesthesia Quick Evaluation

## 2012-12-03 NOTE — Transfer of Care (Signed)
Immediate Anesthesia Transfer of Care Note  Patient: Brian Morgan  Procedure(s) Performed: Procedure(s): HEMORRHOIDECTOMY (N/A)  Patient Location: PACU  Anesthesia Type:General  Level of Consciousness: awake, alert  and oriented  Airway & Oxygen Therapy: Patient Spontanous Breathing and Patient connected to nasal cannula oxygen  Post-op Assessment: Report given to PACU RN, Post -op Vital signs reviewed and stable and Patient moving all extremities X 4  Post vital signs: Reviewed and stable  Complications: No apparent anesthesia complications

## 2012-12-03 NOTE — Brief Op Note (Signed)
12/03/2012  11:17 AM  PATIENT:  Brian Morgan  67 y.o. male  PRE-OPERATIVE DIAGNOSIS:  Hemorrhoids   POST-OPERATIVE DIAGNOSIS:  Hemorrhoids  PROCEDURE:  Procedure(s): HEMORRHOIDECTOMY (N/A)  SURGEON:  Surgeon(s) and Role:    * Raelie Lohr A. Peityn Payton, MD - Primary   ASSISTANTS: none   ANESTHESIA:   local and general  EBL:  Total I/O In: 700 [I.V.:700] Out: -   BLOOD ADMINISTERED:none  DRAINS: none   LOCAL MEDICATIONS USED:  OTHER liposomal  Bupivicaine( exparel)  20 cc  SPECIMEN:  Source of Specimen:  hemorrhoids  DISPOSITION OF SPECIMEN:  PATHOLOGY  COUNTS:  YES  TOURNIQUET:  * No tourniquets in log *  DICTATION: .Other Dictation: Dictation Number [id number]  PLAN OF CARE: Discharge to home after PACU  PATIENT DISPOSITION:  PACU - hemodynamically stable.   Delay start of Pharmacological VTE agent (>24hrs) due to surgical blood loss or risk of bleeding: not applicable

## 2012-12-03 NOTE — Anesthesia Procedure Notes (Signed)
Procedure Name: LMA Insertion Date/Time: 12/03/2012 10:12 AM Performed by: Gayla Medicus Pre-anesthesia Checklist: Patient identified, Timeout performed, Emergency Drugs available, Suction available and Patient being monitored Patient Re-evaluated:Patient Re-evaluated prior to inductionOxygen Delivery Method: Circle system utilized Preoxygenation: Pre-oxygenation with 100% oxygen Intubation Type: IV induction LMA: LMA inserted LMA Size: 4.0 Number of attempts: 1 Placement Confirmation: positive ETCO2 and breath sounds checked- equal and bilateral Tube secured with: Tape Dental Injury: Teeth and Oropharynx as per pre-operative assessment

## 2012-12-04 NOTE — Op Note (Signed)
NAMECHRISTEN, BEDOYA NO.:  [id number]  MEDICAL RECORD NO.:  [id number]  LOCATION:  [facility]                         FACILITY:  [facility]  PHYSICIAN:  Achilles Neville A. Kalynn Declercq, M.D.DATE OF BIRTH:  [birth year]  DATE OF PROCEDURE:  12/03/2012 DATE OF DISCHARGE:  12/03/2012                              OPERATIVE REPORT   PREOPERATIVE DIAGNOSIS:  Prolapse grade 3 internal and external hemorrhoid disease 1 column.  POSTOPERATIVE DIAGNOSIS:  Two column internal and external hemorrhoid disease.  PROCEDURE:  Two column hemorrhoidectomy, both internal and external.  SURGEON:  Anjannette Gauger A. Juni Glaab, M.D.  ANESTHESIA:  LMA with Exparel 20 mL diluted with 20 mL of saline for a perianal block.  EBL:  Minimal.  SPECIMENS:  Hemorrhoid tissue.  INDICATIONS FOR PROCEDURE:  The patient is a 67 year old male with bleeding hemorrhoids.  He was noted to have a large prolapse in the right side.  Recommended excision since this is not amendable to banding or injection.  He was not a good tPA candidate due to the external component.  After discussion of options of surgical treatment, the risks, benefits, and long-term expectations of each as well as nonoperative management of this condition, he wished to proceed.  Risk of bleeding, infection, incontinence, organ injury, need further operations discussed.  After the above discussion, he wished to proceed.  DESCRIPTION OF PROCEDURE:  The patient met in the holding area, questions were answered.  He was taken back to the operating room and placed supine.  After induction of general anesthesia, he was placed in lithotomy and the anal canal was prepped and draped in a sterile fashion.  Time-out was done.  He received preoperative antibiotics. Digital examination was done.  The large prostate but it was smooth. Anoscopy was done.  In the right anterior position, there was a large prolapsed ulcerated hemorrhoid which is grade 3.  I used  Harmonic Scalpel to excise this with care taken not to injure the internal sphincter.  I closed the mucosa with 3-0 Monocryl and the bottom was left open.  The other area of disease was the left lateral position. Both internal and external components were identified and excised with Harmonic scalpel.  The mucosa oversewn with 3-0 Monocryl and the bottom was kept open for drainage.  The remainder of the anal canal was normal. This was sent to pathology for evaluation.  Exparel was used for perianal block.  At this point in time, we placed a packing of Surgicel wrapped around, Gelfoam in the anal canal with excellent hemostasis. All final counts were found to be correct.  Mesh panties placed.  Padding was placed prior to this.  He was taken in lithotomy, extubated, and taken to recovery in satisfactory condition.  All final counts of sponge, needle, and instruments found to be correct at this portion of the case.     Marshelle Bilger A. Amaiah Cristiano, M.D.     TAC/MEDQ  D:  12/03/2012  T:  12/04/2012  Job:  [id number]

## 2012-12-08 ENCOUNTER — Encounter (HOSPITAL_COMMUNITY): Payer: Self-pay | Admitting: Surgery

## 2012-12-08 ENCOUNTER — Telehealth: Payer: Self-pay | Admitting: Family Medicine

## 2012-12-08 NOTE — Telephone Encounter (Signed)
Pt is needing refill on his cialis [facility] [address] Was using [facility] and they closed

## 2012-12-10 ENCOUNTER — Other Ambulatory Visit: Payer: Self-pay | Admitting: Family Medicine

## 2012-12-10 ENCOUNTER — Ambulatory Visit (INDEPENDENT_AMBULATORY_CARE_PROVIDER_SITE_OTHER): Payer: Medicare Other | Admitting: Emergency Medicine

## 2012-12-10 ENCOUNTER — Encounter: Payer: Self-pay | Admitting: Emergency Medicine

## 2012-12-10 VITALS — BP 164/89 | HR 58 | Temp 98.2°F | Ht 72.0 in | Wt 173.0 lb

## 2012-12-10 DIAGNOSIS — N529 Male erectile dysfunction, unspecified: Secondary | ICD-10-CM

## 2012-12-10 DIAGNOSIS — R109 Unspecified abdominal pain: Secondary | ICD-10-CM

## 2012-12-10 DIAGNOSIS — R338 Other retention of urine: Secondary | ICD-10-CM | POA: Insufficient documentation

## 2012-12-10 DIAGNOSIS — R300 Dysuria: Secondary | ICD-10-CM

## 2012-12-10 LAB — POCT URINALYSIS DIPSTICK
Bilirubin, UA: NEGATIVE
Glucose, UA: NEGATIVE
Ketones, UA: NEGATIVE
Leukocytes, UA: NEGATIVE
Spec Grav, UA: 1.015

## 2012-12-10 LAB — BASIC METABOLIC PANEL
CO2: 26 mEq/L (ref 19–32)
Calcium: 9.1 mg/dL (ref 8.4–10.5)
Glucose, Bld: 86 mg/dL (ref 70–99)
Potassium: 3.9 mEq/L (ref 3.5–5.3)
Sodium: 140 mEq/L (ref 135–145)

## 2012-12-10 LAB — POCT UA - MICROSCOPIC ONLY

## 2012-12-10 MED ORDER — TAMSULOSIN HCL 0.4 MG PO CAPS: 0.4000 mg | ORAL_CAPSULE | Freq: Every day | ORAL | Status: DC

## 2012-12-10 MED ORDER — TADALAFIL 5 MG PO TABS: 5.0000 mg | ORAL_TABLET | Freq: Every day | ORAL | Status: DC | PRN

## 2012-12-10 NOTE — Telephone Encounter (Signed)
Refilled Tadalafil to his new pharmacy

## 2012-12-10 NOTE — Patient Instructions (Addendum)
It was nice to meet you! I'm sorry you're having trouble with urination. I think your pain is related to not being able to pee. There is no sign of infection in your urine. Please start Flomax, take 1 pill one a day.  Do NOT take this with Cialis as it might lower your blood pressure. I have placed a referral for you to see a urologist. Please follow up with your doctor in 2-4 weeks or sooner if needed.

## 2012-12-10 NOTE — Progress Notes (Signed)
  Subjective:    Patient ID: Brian Morgan, male    DOB: 05-14-1946, 67 y.o.   MRN: [id number]  HPI Brian Morgan is here for a SDA for right groin pain.  He reports that he has a intermittent sharp pain in the right lower quadrant for the last 2 hours.  It is improved some now.  He also reports difficulty urinating over the last 24 hours.  Was able to urinate a small amount this morning at 9:30am, but it was darker than normal and very difficult to go.  Currently feels like he has to urinate but was unable to give a sample.  No nausea, vomiting, fevers, scrotal pain/swelling/bulging.  He states he has had a kidney stone before, but this is different.  Denies any problems urinating before yesterday.  He did have hemorrhoid surgery 7 days ago and has some stool leakage.  No leg weakness or saddle anesthesia.  He does have 4 girlfriends, but reports using condoms and denies any urethral discharge.  I have reviewed and updated the following as appropriate: allergies and current medications SHx: former smoker    Review of Systems See HPI    Objective:   Physical Exam BP 164/89  Pulse 58  Temp(Src) 98.2 F (36.8 C) (Oral)  Ht 6' (1.829 m)  Wt 173 lb (78.472 kg)  BMI 23.46 kg/m2 Gen: alert, cooperative, NAD, uncomfortable unless standing Abd: +BS, soft, ND, tender in suprapubic region and RLQ, bladder palpable GU: normal testes bilaterally without erythema, tenderness or swelling; no hernias  Ultrasound: enlarged bladder extending to 2cm below umbilicius     Assessment & Plan:

## 2012-12-10 NOTE — Telephone Encounter (Signed)
Patient informed. Brian Morgan S  

## 2012-12-10 NOTE — Assessment & Plan Note (Addendum)
Unknown etiology.  I&O cath with 750cc.  No signs of infection on UA.  No signs of cauda equina.  No history of BPH symptoms.  Did have hemorrhoid surgery 7 days ago, but per the op note there was no trauma to the urinary system.  Will start flomax 0.4mg  daily and refer to urology.  Patient will call the office tomorrow to let us know if he as urinated.  Discussed that he must return to clinic or the ER if he goes 24 hours without urinating.  If this recurs, he will need indwelling Foley until seen by urology.

## 2012-12-11 ENCOUNTER — Telehealth: Payer: Self-pay | Admitting: Family Medicine

## 2012-12-11 NOTE — Telephone Encounter (Signed)
Pt called to say that he has been able to urinate twice since yesterday's visit  Has appt on 5/13 w/ urology

## 2012-12-12 NOTE — Telephone Encounter (Signed)
I appreciate the patient keeping [place] updated.

## 2012-12-15 ENCOUNTER — Encounter (INDEPENDENT_AMBULATORY_CARE_PROVIDER_SITE_OTHER): Payer: Self-pay | Admitting: Surgery

## 2012-12-15 ENCOUNTER — Ambulatory Visit (INDEPENDENT_AMBULATORY_CARE_PROVIDER_SITE_OTHER): Payer: Medicare Other | Admitting: Surgery

## 2012-12-15 VITALS — BP 110/68 | HR 70 | Temp 99.5°F | Resp 16 | Ht 72.0 in | Wt 169.4 lb

## 2012-12-15 DIAGNOSIS — Z9889 Other specified postprocedural states: Secondary | ICD-10-CM

## 2012-12-15 MED ORDER — HYDROMORPHONE HCL 2 MG PO TABS: 2.0000 mg | ORAL_TABLET | ORAL | Status: DC | PRN

## 2012-12-15 NOTE — Progress Notes (Signed)
PT RETURNS AFTER HEMORRHOIDECTOMY. PAIN AN ISSUE AND PERCOCET UPSETTING HIS STOMACH.   PERIANAL REGION clean with some mucus discharge.  No infection. S/p hemorrhoidectomy with benign pathology Try dilaudid 2 mg every 4 hours as needed Stop percocet Cont tub soaks return 3 weeks.

## 2012-12-15 NOTE — Patient Instructions (Signed)
RETURN 3 WEEKS.  CONT TUB SOAKS.

## 2012-12-18 ENCOUNTER — Telehealth: Payer: Self-pay | Admitting: Family Medicine

## 2012-12-18 NOTE — Telephone Encounter (Signed)
Left message on voicemail. Brian Morgan  

## 2012-12-18 NOTE — Telephone Encounter (Signed)
Hi urine analysis does not show infection. Only small amount of red blood cells. This does not require of antibiotic to be called in. He needs to address this hematuria with his urologist. Thanks

## 2012-12-18 NOTE — Telephone Encounter (Signed)
Pt is asking for results of his labs - still not able to urinate all the way  Saw surgeon last Monday and he told him that it is not unusual for patients to experience this after surgery

## 2012-12-30 ENCOUNTER — Telehealth: Payer: Self-pay | Admitting: Emergency Medicine

## 2012-12-30 NOTE — Telephone Encounter (Signed)
Called to check on status of urology referral.  Patient states that the appt keeps getting rescheduled on him.  However, his urinary retention and symptoms have completely resolved.  They were likely caused by the hemorrhoid surgery.  He is planning on cancelling the appt and I think that is appropriate.  I will cancel the referral.

## 2013-01-02 ENCOUNTER — Encounter (INDEPENDENT_AMBULATORY_CARE_PROVIDER_SITE_OTHER): Payer: Medicare Other | Admitting: Surgery

## 2013-02-20 ENCOUNTER — Ambulatory Visit: Payer: Medicare Other | Admitting: Family Medicine

## 2013-02-25 ENCOUNTER — Ambulatory Visit (INDEPENDENT_AMBULATORY_CARE_PROVIDER_SITE_OTHER): Payer: Medicare Other | Admitting: Family Medicine

## 2013-02-25 ENCOUNTER — Encounter: Payer: Self-pay | Admitting: Family Medicine

## 2013-02-25 ENCOUNTER — Ambulatory Visit (HOSPITAL_COMMUNITY)
Admission: RE | Admit: 2013-02-25 | Discharge: 2013-02-25 | Disposition: A | Discharge status: Home / Self Care | Payer: Medicare Other | Source: Ambulatory Visit | Attending: Family Medicine | Admitting: Family Medicine

## 2013-02-25 VITALS — BP 149/79 | HR 57 | Temp 97.9°F | Ht 72.0 in | Wt 165.4 lb

## 2013-02-25 DIAGNOSIS — H8110 Benign paroxysmal vertigo, unspecified ear: Secondary | ICD-10-CM | POA: Insufficient documentation

## 2013-02-25 DIAGNOSIS — N529 Male erectile dysfunction, unspecified: Secondary | ICD-10-CM

## 2013-02-25 DIAGNOSIS — R42 Dizziness and giddiness: Secondary | ICD-10-CM

## 2013-02-25 DIAGNOSIS — I498 Other specified cardiac arrhythmias: Secondary | ICD-10-CM | POA: Insufficient documentation

## 2013-02-25 MED ORDER — TADALAFIL 10 MG PO TABS: 10.0000 mg | ORAL_TABLET | Freq: Every day | ORAL | Status: DC | PRN

## 2013-02-25 MED ORDER — ALBUTEROL SULFATE HFA 108 (90 BASE) MCG/ACT IN AERS: 1.0000 | INHALATION_SPRAY | Freq: Four times a day (QID) | RESPIRATORY_TRACT | Status: DC | PRN

## 2013-02-25 NOTE — Progress Notes (Signed)
Family Medicine Office Visit Note   Subjective:   Patient ID: Brian Morgan, male  DOB: May 05, 1946, 67 y.o.. MRN: [id number]   Pt that comes today complaining of "dizzy spells" when changing position, form sitting to standing or laying down to sitting. Pt describes as if the room spins around him. This episodes are mostly on the morning and or the late afternoon. He was concerned was his blood pressure and stopped taking his HCTZ and Lisinopril for about three days. This decreased the frequency of episodes but did not completely resolved them.  Pt reports got married in April/2014, his wife has 36 y/o and now is pregnant with triplets. He is extremely happy about this and reports wants to take care of himself so he can live longer for his wife and children. He is requesting to increase the dose of Cialis to 10 mg since he has noticed that 1 tab is not enough for him to last the desirable effect.   Pt also reports he does crocheting for relaxation and this has helped him also financially. He wishes to be able to continue this activity.   Review of Systems:  Pt denies SOB, chest pain, palpitations, headaches, numbness or weakness. No changes on urinary or BM habits. No unintentional weigh loss/gain.  Objective:   Physical Exam: Orthostatic BP Lying down: 130/78  Sitting:145/83   Standing: 127/84   Gen:  NAD HEENT: Moist mucous membranes  CV: Mildly bradicardic and rhythm, no murmurs rubs or gallops PULM: Clear to auscultation bilaterally. No wheezes/rales/rhonchi ABD: Soft, non tender, non distended, normal bowel sounds EXT: No edema Neuro: Alert and oriented x3. No focalization. Positive Dix Hallpike maneuver.   Assessment & Plan:

## 2013-02-25 NOTE — Assessment & Plan Note (Signed)
Dix Hallpike maneuver positive. Also concerning multifactorial with orthostatism since pt Office BP systolicreadings are 15 mmHg difference from sitting to standing. EKG today in the office only positive for sinus bradycardia with no significant changes from prior EKG's P/ Discussed benign nature of BPPH and exercises to relocate otoliths.  Since Orthostatism is also present will discontinue diuretic continue Lisinopril 20 mg and instructed pt to check and keep BP with close f/u.

## 2013-02-25 NOTE — Patient Instructions (Addendum)

## 2013-02-25 NOTE — Assessment & Plan Note (Signed)
cialis increased to 10 mg. Discussed with pt the side effect of this medication as potent vasodilator. Instructions about preferable time to take medication given and other mdeication interaction pt needs to avoid.

## 2013-03-06 ENCOUNTER — Telehealth: Payer: Self-pay | Admitting: Family Medicine

## 2013-03-06 NOTE — Telephone Encounter (Signed)
Pt is requesting Dr. Aviva Signs [facility] [address] [facility] . JW

## 2013-03-06 NOTE — Telephone Encounter (Signed)
Will fwd to MD.  Rennie Hack L, CMA  

## 2013-03-09 ENCOUNTER — Encounter: Payer: Self-pay | Admitting: Family Medicine

## 2013-03-09 ENCOUNTER — Other Ambulatory Visit: Payer: Self-pay | Admitting: *Deleted

## 2013-03-09 ENCOUNTER — Ambulatory Visit (INDEPENDENT_AMBULATORY_CARE_PROVIDER_SITE_OTHER): Payer: Medicare Other | Admitting: Family Medicine

## 2013-03-09 VITALS — BP 158/94 | HR 57 | Ht 72.0 in | Wt 165.0 lb

## 2013-03-09 DIAGNOSIS — G5603 Carpal tunnel syndrome, bilateral upper limbs: Secondary | ICD-10-CM

## 2013-03-09 DIAGNOSIS — G8929 Other chronic pain: Secondary | ICD-10-CM

## 2013-03-09 DIAGNOSIS — M25562 Pain in left knee: Secondary | ICD-10-CM

## 2013-03-09 DIAGNOSIS — M25569 Pain in unspecified knee: Secondary | ICD-10-CM

## 2013-03-09 DIAGNOSIS — M1712 Unilateral primary osteoarthritis, left knee: Secondary | ICD-10-CM

## 2013-03-09 DIAGNOSIS — G5600 Carpal tunnel syndrome, unspecified upper limb: Secondary | ICD-10-CM

## 2013-03-09 DIAGNOSIS — N529 Male erectile dysfunction, unspecified: Secondary | ICD-10-CM

## 2013-03-09 DIAGNOSIS — M1710 Unilateral primary osteoarthritis, unspecified knee: Secondary | ICD-10-CM

## 2013-03-09 MED ORDER — TADALAFIL 10 MG PO TABS: 10.0000 mg | ORAL_TABLET | Freq: Every day | ORAL | Status: DC | PRN

## 2013-03-09 MED ORDER — METHYLPREDNISOLONE ACETATE 40 MG/ML IJ SUSP
40.0000 mg | Freq: Once | INTRAMUSCULAR | Status: AC
  Administered 2013-03-09: 40 mg via INTRA_ARTICULAR

## 2013-03-10 NOTE — Progress Notes (Signed)
  Subjective:    Patient ID: Brian Morgan, male    DOB: July 03, 1946, 67 y.o.   MRN: [id number]  HPI Left knee pain. History of osteoarthritis. He had x-rays done at an outside facility about 2 years ago. We gave him corticosteroid injection almost a year ago and he has done extremely well. #2. Right wrist pain. About a year and a half ago he got an injection from Dr. Laural Benes for this and it really improved his symptoms until about 2 weeks ago. He would like to pursue a second injection. He has not been bothered up until the last 3 days when he started experiencing some rather severe numbness and tingling particularly at night.  Social history: Recently married. His wife is pregnant with triplets.  Review of Systems No fever, no sweats, no chills.    Objective:   Physical Exam Vital signs are reviewed GENERAL: Well-developed male no acute distress KNEES: Bilaterally symmetrical without erythema, warmth or effusion. Left knee full range of motion flexion and extension. Medial jointline tenderness. The calf is soft. The popliteal space is soft. He has intact neurovascular status bilaterally in the feet appear RISK: Right. Full range of motion flexion and extension. There is no thenar atrophy. Positive Tinel, positive Phalen and 20 seconds. Grip strength is intact ULTRASOUND: The median nerve in the right wrist is extremely flattened. . INJECTION: Patient was given informed consent, signed copy in the chart. Appropriate time out was taken. Area prepped and draped in usual sterile fashion. One cc of methylprednisolone 40 mg/ml plus  one cc of 1% lidocaine without epinephrine was injected into the right wrist carpal tunnel using a(n) ankle ultrasound-guided approach. The patient tolerated the procedure well. There were no complications. Post procedure instructions were given.  INJECTION: Patient was given informed consent, signed copy in the chart. Appropriate time out was taken. Area prepped and  draped in usual sterile fashion. One cc of methylprednisolone 40 mg/ml plus  4 cc of 1% lidocaine without epinephrine was injected into the left knee using a(n) anterior approach. The patient tolerated the procedure well. There were no complications. Post procedure instructions were given.  .       Assessment & Plan:  #1. Left knee ulcer arthritis. I would like it's more current x-rays and will set him up for that. Corticosteroid injection today. #2. Right wrist carpal tunnel. Certainly the ultrasound is consistent with carpal tunnel symptoms. He has a very flattened median nerve. Should his symptoms persist, I think we should pursue EMG and possible surgical intervention or he is going to have long-standing problems.

## 2013-03-12 ENCOUNTER — Ambulatory Visit: Payer: Medicare Other | Admitting: Family Medicine

## 2013-03-20 ENCOUNTER — Ambulatory Visit: Payer: Medicare Other | Admitting: Family Medicine

## 2013-04-28 ENCOUNTER — Encounter: Payer: Medicare Other | Admitting: Family Medicine

## 2013-05-07 ENCOUNTER — Encounter: Payer: Medicare Other | Admitting: Family Medicine

## 2013-05-07 ENCOUNTER — Ambulatory Visit (INDEPENDENT_AMBULATORY_CARE_PROVIDER_SITE_OTHER): Payer: Medicare Other | Admitting: Family Medicine

## 2013-05-07 ENCOUNTER — Encounter: Payer: Self-pay | Admitting: Family Medicine

## 2013-05-07 VITALS — BP 127/73 | HR 58 | Temp 98.5°F | Ht 72.0 in | Wt 166.9 lb

## 2013-05-07 DIAGNOSIS — G8929 Other chronic pain: Secondary | ICD-10-CM

## 2013-05-07 DIAGNOSIS — Z0000 Encounter for general adult medical examination without abnormal findings: Secondary | ICD-10-CM

## 2013-05-07 DIAGNOSIS — J45909 Unspecified asthma, uncomplicated: Secondary | ICD-10-CM | POA: Insufficient documentation

## 2013-05-07 DIAGNOSIS — Z23 Encounter for immunization: Secondary | ICD-10-CM

## 2013-05-07 DIAGNOSIS — I10 Essential (primary) hypertension: Secondary | ICD-10-CM

## 2013-05-07 DIAGNOSIS — M549 Dorsalgia, unspecified: Secondary | ICD-10-CM

## 2013-05-07 MED ORDER — DICLOFENAC SODIUM 75 MG PO TBEC: 75.0000 mg | DELAYED_RELEASE_TABLET | Freq: Two times a day (BID) | ORAL | Status: DC

## 2013-05-07 NOTE — Assessment & Plan Note (Addendum)
At goal today. No changes to medications, refills given. Plan: -Fasting lipid panel (to be done on re-visit)

## 2013-05-07 NOTE — Assessment & Plan Note (Signed)
Pneumovax today. Deferring flu vaccine until later this month. Up to date on colonoscopy.

## 2013-05-07 NOTE — Assessment & Plan Note (Signed)
Uses albuterol at least 2x per week. Not on a controller. Given smoking history, would likely benefit from PFTs and long acting ICS.

## 2013-05-07 NOTE — Assessment & Plan Note (Signed)
Complaining of continued back pain unrelieved by tramadol and neurontin. Chronic issue, says he has had steroid injections in the past with some relief. MRI done in Sept 2013. Plan: -Diclofenac, if still unrelieved could try mobic. -Consider PT for core strengthening and stretching.

## 2013-05-07 NOTE — Patient Instructions (Signed)
It was nice to meet you today.   Try taking the diclofenac for your back pain. You can start with taking half a tablet and make sure you do not get a rash from it. If this does not work then the other medication to try would be mobic. If the back pain continues you can also try contacting [facility] to get another steroid injection.   We will schedule you a lab visit to check your cholesterol. This should be done fasting, so ideally in the morning. We will call or send you a letter with the results.

## 2013-05-07 NOTE — Progress Notes (Addendum)
  Subjective:    Patient ID: Brian Morgan, male    DOB: 06-12-1946, 67 y.o.   MRN: [id number]  Back Pain Associated symptoms include headaches. Pertinent negatives include no abdominal pain, chest pain, dysuria or numbness.  Knee Pain  Pertinent negatives include no numbness.    # Health maintenance - Colonoscopy up to date. - Pneumovax today, flu shot deferred until later this month (wants to get it with his wife) - Information given for zostervax  # Back pain - Chronic, unchanged, MRI showed several DGD, also has sciatica - Takes tramadol and neurontin - Wants something else, mentions percocet doesn't work and oxycodone had previously been given with some relief ROS: no bladder/bowel incontinence  # Tobacco abuse - Using e-cig, smokes 1-2 cigarettes a day  # Asthma - Uses albuterol at least 2x per week, 2-4 puffs a day  # Hemorrhoids - No problems since surgical intervention earlier this year ROS: no blood in stool or toilet paper.  Review of Systems  Constitutional: Negative for activity change, appetite change and unexpected weight change.  HENT: Negative for congestion, rhinorrhea and trouble swallowing.   Respiratory: Negative for shortness of breath.   Cardiovascular: Negative for chest pain.  Gastrointestinal: Negative for abdominal pain, constipation and blood in stool.  Genitourinary: Negative for dysuria and difficulty urinating.  Musculoskeletal: Positive for back pain and arthralgias.  Skin: Negative for color change.  Neurological: Positive for headaches. Negative for syncope, light-headedness and numbness.       Objective:   Physical Exam BP 127/73  Pulse 58  Temp(Src) 98.5 F (36.9 C) (Oral)  Ht 6' (1.829 m)  Wt 166 lb 14.4 oz (75.705 kg)  BMI 22.63 kg/m2  General: appears as stated age, with wife in room (pregnant with triplets) whom complains about being pregnant and concern for sciatica in her back HEENT: PERRL, EOMI, dentition normal, no  erythema of oropharynx CV: RRR, normal heart sounds, no murmurs Resp: CTAB, effort normal Abdomen: soft, non-distended, non-tender. Palpable abdominal aortic pulse GU/rectal: patient deferred Extremities: no edema, 2+ PT pulses     Assessment & Plan:  See Problem List documentation

## 2013-06-22 ENCOUNTER — Telehealth: Payer: Self-pay | Admitting: *Deleted

## 2013-06-22 NOTE — Telephone Encounter (Signed)
Patient calling asking for refills for lisinopril and tramadol. [facility] [address]. Will forward to PCP and team.

## 2013-06-23 ENCOUNTER — Other Ambulatory Visit: Payer: Self-pay | Admitting: Family Medicine

## 2013-06-23 MED ORDER — LISINOPRIL 20 MG PO TABS: 20.0000 mg | ORAL_TABLET | Freq: Every morning | ORAL | Status: DC

## 2013-06-23 NOTE — Telephone Encounter (Signed)
Lisinopril refilled. For Tramadol he needs medical evaluation.

## 2013-07-24 ENCOUNTER — Encounter: Payer: Self-pay | Admitting: Family Medicine

## 2013-07-24 ENCOUNTER — Ambulatory Visit (INDEPENDENT_AMBULATORY_CARE_PROVIDER_SITE_OTHER): Payer: Medicare Other | Admitting: Family Medicine

## 2013-07-24 VITALS — BP 165/89 | HR 57 | Ht 72.0 in | Wt 166.0 lb

## 2013-07-24 DIAGNOSIS — G8929 Other chronic pain: Secondary | ICD-10-CM

## 2013-07-24 DIAGNOSIS — G5600 Carpal tunnel syndrome, unspecified upper limb: Secondary | ICD-10-CM

## 2013-07-24 DIAGNOSIS — G5603 Carpal tunnel syndrome, bilateral upper limbs: Secondary | ICD-10-CM

## 2013-07-24 DIAGNOSIS — M25569 Pain in unspecified knee: Secondary | ICD-10-CM

## 2013-07-24 NOTE — Patient Instructions (Signed)

## 2013-07-24 NOTE — Progress Notes (Signed)
Sports Medicine Office Visit Note   Subjective:   Patient ID: Brian Morgan, male  DOB: Dec 11, 1945, 67 y.o.. MRN: [id number]   Pt that comes today complaining of flare of his arthritic left knee pain sencondary to a recent  fall and also a flare of his R carpal tunnel syndrome.  L knee pain:  PMHx of knee osteoarthritis treated with steroid injection x 2 (1 y/ago and 66ms/ago) with good response.  He reports last week fell on his knee while chasing his cat and aggravated his symptoms. He reports mild to moderated swelling that resolved, never erythema or bruising seen. His pain is now mostly on medial aspect of joint line. Pt is requesting steroid injection today.  R wrist: PMHx of Carpal Tunnel, had steroid injection 4 months ago with good response. After the fall described above, his symptoms re- appeared. Denied  direct trauma, swelling or erythema. He also is requesting steroid shot for this condition, as it has helped in the past.   Review of Systems:  Pt denies SOB, chest pain, palpitations, headaches, dizziness, numbness or weakness, fever or chills.   Objective:   Physical Exam: Gen:  NAD MSK:  R wrist: no deformities, normal ROM. Tinel and Phalen are positive.   Left Knee: Mild effusion on medial and lateral aspect of knee visible only with flexion. Crepitus to active extension of knee. Normal ROM. No knee laxity. Tenderness on medial and lateral aspect of joint line.   R WRIST INJECTION: Patient was given informed consent, signed copy in the chart. Appropriate time out was taken. Area prepped in usual sterile fashion. 1 cc of methylprednisolone 40 mg/ml plus one cc of 1% lidocaine without epinephrine was injected into the right wrist carpal tunnel. The patient tolerated the procedure well. There were no complications. Post procedure instructions were given.   L KNEE INJECTION: Patient was given informed consent, signed copy in the chart. Appropriate time out was taken. Area prepped  and draped in usual sterile fashion. 1 cc of methylprednisolone 40 mg/ml plus 4 cc of 1% lidocaine without epinephrine was injected into the left knee using anterior approach. The patient tolerated the procedure well. There were no complications. Post procedure instructions were given.   Assessment & Plan:   #1. Left Knee Osteoarthritis aggravated after trauma. No red flags for fracture nor laxity. Plan: corticosteroid  Injection today. F/u as needed  #2 R Carpal Tunnel Syndrome also aggravated after trauma. Physical exam is consistent with carpal tunnel and without other concerning findings.  Plan: corticosteroid injection today. Possible EMG and surgical evaluation per last visit discussion.   Discussed signs of worsening condition that should prompt re-evaluation.

## 2013-08-03 ENCOUNTER — Other Ambulatory Visit: Payer: Self-pay | Admitting: *Deleted

## 2013-08-03 ENCOUNTER — Ambulatory Visit: Payer: Medicare Other | Admitting: Family Medicine

## 2013-08-03 NOTE — Telephone Encounter (Signed)
Spoke with patient about rescheduling his appointment due to provider not in clinic today. The reason for the patient being in clinic is for his medication refills. Which he would like in 90 day quantities.

## 2013-08-10 MED ORDER — GABAPENTIN 300 MG PO CAPS: 600.0000 mg | ORAL_CAPSULE | Freq: Every day | ORAL | Status: DC

## 2013-10-28 ENCOUNTER — Telehealth: Payer: Self-pay | Admitting: *Deleted

## 2013-10-28 NOTE — Telephone Encounter (Signed)
LM for patient to call back.  Need to find out if he received a flu shot this year and if so where.  Thanks [employer]

## 2013-11-09 ENCOUNTER — Ambulatory Visit: Payer: Medicare Other | Admitting: Family Medicine

## 2013-11-16 ENCOUNTER — Ambulatory Visit: Payer: Medicare Other | Admitting: Family Medicine

## 2013-11-18 ENCOUNTER — Ambulatory Visit: Payer: Medicare Other | Admitting: Family Medicine

## 2013-11-20 ENCOUNTER — Ambulatory Visit: Payer: Medicare Other | Admitting: Family Medicine

## 2013-11-24 ENCOUNTER — Ambulatory Visit (INDEPENDENT_AMBULATORY_CARE_PROVIDER_SITE_OTHER): Payer: Medicare Other | Admitting: Family Medicine

## 2013-11-24 ENCOUNTER — Encounter: Payer: Self-pay | Admitting: Family Medicine

## 2013-11-24 VITALS — BP 132/74 | HR 69 | Temp 98.8°F | Ht 72.0 in | Wt 155.0 lb

## 2013-11-24 DIAGNOSIS — I10 Essential (primary) hypertension: Secondary | ICD-10-CM

## 2013-11-24 DIAGNOSIS — G8929 Other chronic pain: Secondary | ICD-10-CM

## 2013-11-24 DIAGNOSIS — M549 Dorsalgia, unspecified: Secondary | ICD-10-CM

## 2013-11-24 DIAGNOSIS — N529 Male erectile dysfunction, unspecified: Secondary | ICD-10-CM

## 2013-11-24 DIAGNOSIS — J45909 Unspecified asthma, uncomplicated: Secondary | ICD-10-CM

## 2013-11-24 MED ORDER — TRAMADOL HCL 50 MG PO TABS: 50.0000 mg | ORAL_TABLET | Freq: Every day | ORAL | Status: DC | PRN

## 2013-11-24 MED ORDER — LISINOPRIL 20 MG PO TABS: 20.0000 mg | ORAL_TABLET | Freq: Every morning | ORAL | Status: DC

## 2013-11-24 MED ORDER — ALBUTEROL SULFATE HFA 108 (90 BASE) MCG/ACT IN AERS: 1.0000 | INHALATION_SPRAY | Freq: Four times a day (QID) | RESPIRATORY_TRACT | Status: AC | PRN

## 2013-11-24 MED ORDER — GABAPENTIN 300 MG PO CAPS: 600.0000 mg | ORAL_CAPSULE | Freq: Every day | ORAL | Status: DC

## 2013-11-24 MED ORDER — TADALAFIL 10 MG PO TABS: 10.0000 mg | ORAL_TABLET | Freq: Every day | ORAL | Status: DC | PRN

## 2013-11-24 NOTE — Assessment & Plan Note (Addendum)
No signs of nerve compression. On gabapentin and tramadol for pain. No change in management. Pt has appointment in 4/24 with SM

## 2013-11-24 NOTE — Assessment & Plan Note (Signed)
Only on Lisinopril. HCTZ was d/cd. BP is control on this medication

## 2013-11-24 NOTE — Assessment & Plan Note (Signed)
No recent exacerbations. Albuterol refilled today

## 2013-11-24 NOTE — Patient Instructions (Signed)
It has been a pleasure to see you today. Please take the medications as prescribed. F/u in 3-4 months or sooner if needed

## 2013-11-24 NOTE — Progress Notes (Signed)
Family Medicine Office Visit Note   Subjective:   Patient ID: Brian Morgan, male  DOB: 1945-09-10, 68 y.o.. MRN: [id number]   Pt that comes today to refill his prescriptions. He reports is doing well, some occasional pain on his back and left knee that he controls with NSAIDs and tramadol, other than that he has no present complaints. His wife lost her pregnancy last Feb, and they have been coping with the loss. She accompanies him today and they seem to be stable and adjusting properly. No symptoms of depression.   Review of Systems:  Pt denies SOB, chest pain, palpitations, headaches, dizziness, numbness or weakness. No changes on urinary or BM habits. No unintentional weigh loss/gain.  Objective:   Physical Exam: Gen:  NAD HEENT: Moist mucous membranes  CV: Regular rate and rhythm, no murmurs rubs or gallops PULM: Clear to auscultation bilaterally. No wheezes/rales/rhonchi ABD: Soft, non tender, non distended, normal bowel sounds EXT: No edema Neuro: Alert and oriented x3. No focalization  Assessment & Plan:

## 2013-11-24 NOTE — Assessment & Plan Note (Signed)
Prescription for cialis given today. Discussed side effects of medication an potential pharmacologic  Interactions. Pt aware of this.

## 2013-11-27 ENCOUNTER — Ambulatory Visit (INDEPENDENT_AMBULATORY_CARE_PROVIDER_SITE_OTHER): Payer: Medicare Other | Admitting: Family Medicine

## 2013-11-27 VITALS — BP 159/93

## 2013-11-27 DIAGNOSIS — G5600 Carpal tunnel syndrome, unspecified upper limb: Secondary | ICD-10-CM

## 2013-11-27 DIAGNOSIS — M1990 Unspecified osteoarthritis, unspecified site: Secondary | ICD-10-CM

## 2013-11-27 MED ORDER — METHYLPREDNISOLONE ACETATE 40 MG/ML IJ SUSP
40.0000 mg | Freq: Once | INTRAMUSCULAR | Status: AC
  Administered 2013-11-27: 40 mg via INTRA_ARTICULAR

## 2013-11-27 NOTE — Progress Notes (Signed)
CC: Followup left knee pain, right carpal tunnel HPI: Patient returns for followup of the above complaints. Last injection was in December. He has been experiencing knee pain for about a month now so the previous injection lasted him for 3 months. He finds injections to be very very helpful. He is able to function much better after the injection. He is on tramadol chronically for his back pain.  He is also complaining of recurrent carpal tunnel syndrome symptoms. Last injection was also in December. He is getting numbness and pain in his entire hand. He does feel like he has intermittent grip weakness. He has had excellent temporary response to corticosteroid injection several times in the past.  ROS: As above in the HPI. All other systems are stable or negative.  OBJECTIVE: APPEARANCE:  Patient in no acute distress.The patient appeared well nourished and normally developed. HEENT: No scleral icterus. Conjunctiva non-injected Resp: Non labored Skin: No rash MSK:  Left Knee - Inspection normal with no erythema or effusion or obvious bony abnormalities.  - Palpation with mild joint line tenderness - No effusion Right wrist: Positive Tinel's and Phalen's  MSK [place]: Not performed   ASSESSMENT: #1. Left knee osteoarthritis #2. Right wrist recurrent carpal tunnel   PLAN: Discussed treatment options with the patient. We will repeat his corticosteroid injection into his left knee at this time. I did discuss with him that I feel like at this point he need surgical consultation for possible carpal tunnel surgery. He has had excellent temporary response to corticosteroid injection in the past but continues to have recurrence of his symptoms. We will get him referred over there.  Consent obtained and verified.  Time-out conducted.  Noted no overlying erythema, induration, or other signs of local infection.  Skin prepped in a sterile fashion.  Topical analgesic spray: Ethyl chloride.  Joint:  Left knee Needle: 22-gauge 1-1/2 inch Completed without difficulty.  Meds: 4 cc 1% lidocaine, 1 cc depomedrol Advised to call if fevers/chills, erythema, induration, drainage, or persistent bleeding.

## 2013-11-27 NOTE — Patient Instructions (Signed)
You have been scheduled for an appointment on 12/02/13 at 9 am with Dr. Leanora Cover at [facility]  Their office is located at [address] [place], [address]  [phone]

## 2013-12-02 ENCOUNTER — Telehealth: Payer: Self-pay | Admitting: *Deleted

## 2013-12-02 NOTE — Telephone Encounter (Signed)
Dr. Levell July office called to let Dr. Nori Riis know this patient no showed his appt today.

## 2013-12-04 ENCOUNTER — Encounter: Payer: Self-pay | Admitting: Family Medicine

## 2013-12-04 NOTE — Progress Notes (Signed)
Patient ID: Brian Morgan, male   DOB: 1946-02-26, 68 y.o.   MRN: [id number] [place] Attending Note: I have seen and examined this patient. I have discussed this patient with the resident and reviewed the assessment and plan as documented above. I agree with the resident's findings and plan.

## 2014-03-02 ENCOUNTER — Telehealth: Payer: Self-pay | Admitting: Family Medicine

## 2014-03-02 NOTE — Telephone Encounter (Signed)
Pt called and would like a referral to Dr. Silver Huguenin for pain management. [phone]. jw

## 2014-04-09 ENCOUNTER — Encounter: Payer: Self-pay | Admitting: Family Medicine

## 2014-04-09 ENCOUNTER — Ambulatory Visit (INDEPENDENT_AMBULATORY_CARE_PROVIDER_SITE_OTHER): Payer: Medicare Other | Admitting: Family Medicine

## 2014-04-09 VITALS — BP 151/87 | HR 62 | Ht 72.0 in | Wt 165.0 lb

## 2014-04-09 DIAGNOSIS — G5600 Carpal tunnel syndrome, unspecified upper limb: Secondary | ICD-10-CM | POA: Diagnosis present

## 2014-04-09 DIAGNOSIS — G8929 Other chronic pain: Secondary | ICD-10-CM | POA: Diagnosis not present

## 2014-04-09 DIAGNOSIS — M25562 Pain in left knee: Secondary | ICD-10-CM

## 2014-04-09 DIAGNOSIS — G5603 Carpal tunnel syndrome, bilateral upper limbs: Secondary | ICD-10-CM

## 2014-04-09 DIAGNOSIS — M25569 Pain in unspecified knee: Secondary | ICD-10-CM | POA: Diagnosis not present

## 2014-04-09 MED ORDER — METHYLPREDNISOLONE ACETATE 40 MG/ML IJ SUSP
40.0000 mg | Freq: Once | INTRAMUSCULAR | Status: AC
  Administered 2014-04-09: 40 mg via INTRA_ARTICULAR

## 2014-04-09 NOTE — Progress Notes (Signed)
Patient ID: Brian Morgan, male   DOB: June 02, 1946, 68 y.o.   MRN: [id number] [place] Attending Note: I have seen and examined this patient. I have discussed this patient with the resident and reviewed the assessment and plan as documented above. I agree with the resident's findings and plan.

## 2014-04-09 NOTE — Progress Notes (Signed)
   Subjective:    Patient ID: Brian Morgan, male    DOB: 08-Oct-1945, 68 y.o.   MRN: [id number]  HPI Patient returns for followup of the above complaints. Last injection was about 5 months ago. He has been experiencing knee pain for about a month now so the previous injection lasted him until now. He finds injections to be very very helpful. He is able to function much better after the injection. He is on tramadol and gabapentin chronically for his back pain. He is not interested in surgical options at the present time.  He is also complaining of recurrent carpal tunnel syndrome symptoms. Last injection was also in about five months ago. He is getting numbness and pain in his entire hand. He does feel like he has intermittent grip weakness. He has had excellent temporary response to corticosteroid injection several times in the past and requests another of these today.  He did have a fall about a week ago and has had some increasing back pain in the meantime.  Seen at the ED and given percocet.  He would like a referral to a pain specialist as he feels that his current tramadol and gabapentin may not be enough in the future to control his increased pain.  Review of Systems As per HPI    Objective:   Physical Exam Gen: Well appearing thin male, NAD  Knee: Left knee with severe crepitus to extension, no overlying erythema, no joint effusion noted, stable to drawer, varus, and valgus stress  Wrist: Right wrist with positive nerve compression test, some tingling over first three digits of right hand, no atrophy noted  Back with pain to flexion and extension, normal sensation in feet bilaterally, no tenderness to midline palpation  Assessment & Plan:  Discussed treatment options with the patient. We will repeat his corticosteroid injection into his left knee at this time. He has had excellent temporary response to corticosteroid injection in the past but continues to have recurrence of his  symptoms.  No interest in surgery.  No acute changes on physical exam.  Consent obtained and verified.  Time-out conducted.  Noted no overlying erythema, induration, or other signs of local infection.  Skin prepped in a sterile fashion with betadine Topical analgesic spray: Ethyl chloride.  Joint: Left knee   Needle: 22-gauge 1-1/2 inch  Completed without difficulty.  Meds: 4 cc 1% lidocaine, 1 cc depomedrol  Advised to call if fevers/chills, erythema, induration, drainage, or persistent bleeding  Consent obtained and verified.  Time-out conducted.  Noted no overlying erythema, induration, or other signs of local infection.  Skin prepped in a sterile fashion with betadine Topical analgesic spray: Ethyl chloride.  Joint: Right wrist carpal tunnel Needle: 22-gauge 1-1/2 inch  Completed without difficulty.  Meds: 1 cc 1% lidocaine, 1 cc depomedrol  Advised to call if fevers/chills, erythema, induration, drainage, or persistent bleeding  Patient will otherwise continue with current pain management.  He will need a referral from his primary physician to a pain management specialist. He now lives in [place], [place] about an hour and a half away from our clinic. To f/u as needed for his wrist and knee pain.  Note written by resident physician Excell Seltzer

## 2015-10-27 ENCOUNTER — Encounter: Payer: Self-pay | Admitting: Gastroenterology

## 2018-04-13 ENCOUNTER — Emergency Department (HOSPITAL_COMMUNITY)
Admission: EM | Admit: 2018-04-13 | Discharge: 2018-04-13 | Disposition: A | Discharge status: Home / Self Care | Payer: 59 | Attending: Emergency Medicine | Admitting: Emergency Medicine

## 2018-04-13 ENCOUNTER — Other Ambulatory Visit: Payer: Self-pay

## 2018-04-13 ENCOUNTER — Encounter (HOSPITAL_COMMUNITY): Payer: Self-pay | Admitting: Emergency Medicine

## 2018-04-13 DIAGNOSIS — I10 Essential (primary) hypertension: Secondary | ICD-10-CM | POA: Insufficient documentation

## 2018-04-13 DIAGNOSIS — R339 Retention of urine, unspecified: Secondary | ICD-10-CM | POA: Diagnosis not present

## 2018-04-13 DIAGNOSIS — Z79899 Other long term (current) drug therapy: Secondary | ICD-10-CM | POA: Diagnosis not present

## 2018-04-13 DIAGNOSIS — Z87891 Personal history of nicotine dependence: Secondary | ICD-10-CM | POA: Diagnosis not present

## 2018-04-13 LAB — BASIC METABOLIC PANEL
ANION GAP: 10 (ref 5–15)
BUN: 18 mg/dL (ref 8–23)
CHLORIDE: 100 mmol/L (ref 98–111)
CO2: 27 mmol/L (ref 22–32)
Calcium: 8.6 mg/dL — ABNORMAL LOW (ref 8.9–10.3)
Creatinine, Ser: 1.17 mg/dL (ref 0.61–1.24)
GFR calc Af Amer: >60 mL/min (ref >60)
Glucose, Bld: 108 mg/dL — ABNORMAL HIGH (ref 70–99)
POTASSIUM: 3.4 mmol/L — AB (ref 3.5–5.1)
Sodium: 137 mmol/L (ref 135–145)

## 2018-04-13 LAB — CBC
HEMATOCRIT: 40.3 % (ref 39.0–52.0)
HEMOGLOBIN: 12.9 g/dL — AB (ref 13.0–17.0)
MCH: 31.3 pg (ref 26.0–34.0)
MCHC: 32 g/dL (ref 30.0–36.0)
MCV: 97.8 fL (ref 78.0–100.0)
PLATELETS: 264 10*3/uL (ref 150–400)
RBC: 4.12 MIL/uL — AB (ref 4.22–5.81)
RDW: 13.7 % (ref 11.5–15.5)
WBC: 8.9 10*3/uL (ref 4.0–10.5)

## 2018-04-13 LAB — URINALYSIS, ROUTINE W REFLEX MICROSCOPIC
Bilirubin Urine: NEGATIVE
GLUCOSE, UA: NEGATIVE mg/dL
Ketones, ur: NEGATIVE mg/dL
Nitrite: NEGATIVE
PROTEIN: NEGATIVE mg/dL
Specific Gravity, Urine: 1.012 (ref 1.005–1.030)
pH: 6 (ref 5.0–8.0)

## 2018-04-13 MED ORDER — LIDOCAINE HCL URETHRAL/MUCOSAL 2 % EX GEL
1.0000 "application " | Freq: Once | CUTANEOUS | Status: AC
  Administered 2018-04-13: 1 via TOPICAL
  Filled 2018-04-13: qty 20

## 2018-04-13 MED ORDER — TAMSULOSIN HCL 0.4 MG PO CAPS: 0.4000 mg | ORAL_CAPSULE | Freq: Every day | ORAL | 1 refills | Status: AC
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
## 2018-04-13 NOTE — Discharge Instructions (Addendum)
Follow up with the urologist as discussed, keep the foley catheter in place until cleared by urology

## 2018-04-13 NOTE — ED Triage Notes (Signed)
Pt. Stated, I have not urinated in 2 1/2 days., very uncomfortable.

## 2018-04-13 NOTE — ED Notes (Signed)
Patient verbalizes understanding of discharge instructions. Opportunity for questioning and answers were provided. Armband removed by staff, pt discharged from ED.  

## 2018-04-21 ENCOUNTER — Other Ambulatory Visit: Payer: Self-pay

## 2018-04-21 ENCOUNTER — Encounter (HOSPITAL_COMMUNITY): Payer: Self-pay | Admitting: *Deleted

## 2018-04-21 ENCOUNTER — Emergency Department (HOSPITAL_COMMUNITY)
Admission: EM | Admit: 2018-04-21 | Discharge: 2018-04-21 | Disposition: A | Discharge status: Home / Self Care | Payer: 59 | Attending: Emergency Medicine | Admitting: Emergency Medicine

## 2018-04-21 DIAGNOSIS — N3090 Cystitis, unspecified without hematuria: Secondary | ICD-10-CM | POA: Insufficient documentation

## 2018-04-21 DIAGNOSIS — I10 Essential (primary) hypertension: Secondary | ICD-10-CM | POA: Diagnosis not present

## 2018-04-21 DIAGNOSIS — J45909 Unspecified asthma, uncomplicated: Secondary | ICD-10-CM | POA: Insufficient documentation

## 2018-04-21 DIAGNOSIS — R339 Retention of urine, unspecified: Secondary | ICD-10-CM | POA: Diagnosis present

## 2018-04-21 DIAGNOSIS — Z87891 Personal history of nicotine dependence: Secondary | ICD-10-CM | POA: Diagnosis not present

## 2018-04-21 DIAGNOSIS — R197 Diarrhea, unspecified: Secondary | ICD-10-CM | POA: Diagnosis not present

## 2018-04-21 DIAGNOSIS — Z79899 Other long term (current) drug therapy: Secondary | ICD-10-CM | POA: Insufficient documentation

## 2018-04-21 LAB — COMPREHENSIVE METABOLIC PANEL
ALK PHOS: 42 U/L (ref 38–126)
ALT: 40 U/L (ref 0–44)
ANION GAP: 13 (ref 5–15)
AST: 64 U/L — ABNORMAL HIGH (ref 15–41)
Albumin: 3 g/dL — ABNORMAL LOW (ref 3.5–5.0)
BUN: 7 mg/dL — ABNORMAL LOW (ref 8–23)
CALCIUM: 8.5 mg/dL — AB (ref 8.9–10.3)
CHLORIDE: 100 mmol/L (ref 98–111)
CO2: 23 mmol/L (ref 22–32)
Creatinine, Ser: 0.82 mg/dL (ref 0.61–1.24)
GFR calc non Af Amer: >60 mL/min (ref >60)
Glucose, Bld: 112 mg/dL — ABNORMAL HIGH (ref 70–99)
Potassium: 3 mmol/L — ABNORMAL LOW (ref 3.5–5.1)
SODIUM: 136 mmol/L (ref 135–145)
Total Bilirubin: 0.8 mg/dL (ref 0.3–1.2)
Total Protein: 7 g/dL (ref 6.5–8.1)

## 2018-04-21 LAB — CBC
HCT: 40.3 % (ref 39.0–52.0)
HEMOGLOBIN: 13.1 g/dL (ref 13.0–17.0)
MCH: 31.6 pg (ref 26.0–34.0)
MCHC: 32.5 g/dL (ref 30.0–36.0)
MCV: 97.3 fL (ref 78.0–100.0)
Platelets: 293 10*3/uL (ref 150–400)
RBC: 4.14 MIL/uL — AB (ref 4.22–5.81)
RDW: 13.4 % (ref 11.5–15.5)
WBC: 11.9 10*3/uL — ABNORMAL HIGH (ref 4.0–10.5)

## 2018-04-21 LAB — URINALYSIS, ROUTINE W REFLEX MICROSCOPIC
BILIRUBIN URINE: NEGATIVE
Glucose, UA: NEGATIVE mg/dL
KETONES UR: NEGATIVE mg/dL
Nitrite: NEGATIVE
PROTEIN: NEGATIVE mg/dL
Specific Gravity, Urine: 1.003 — ABNORMAL LOW (ref 1.005–1.030)
pH: 6 (ref 5.0–8.0)

## 2018-04-21 LAB — LIPASE, BLOOD: LIPASE: 48 U/L (ref 11–51)

## 2018-04-21 MED ORDER — SULFAMETHOXAZOLE-TRIMETHOPRIM 800-160 MG PO TABS
1.0000 | ORAL_TABLET | Freq: Once | ORAL | Status: AC
  Administered 2018-04-21: 1 via ORAL
  Filled 2018-04-21: qty 1

## 2018-04-21 MED ORDER — SULFAMETHOXAZOLE-TRIMETHOPRIM 800-160 MG PO TABS: 1.0000 | ORAL_TABLET | Freq: Two times a day (BID) | ORAL | 0 refills | Status: DC

## 2018-04-21 MED ORDER — POTASSIUM CHLORIDE CRYS ER 20 MEQ PO TBCR
40.0000 meq | EXTENDED_RELEASE_TABLET | Freq: Once | ORAL | Status: AC
  Administered 2018-04-21: 40 meq via ORAL
  Filled 2018-04-21: qty 2

## 2018-04-21 MED ORDER — OXYCODONE-ACETAMINOPHEN 5-325 MG PO TABS
1.0000 | ORAL_TABLET | Freq: Once | ORAL | Status: AC
  Administered 2018-04-21: 1 via ORAL
  Filled 2018-04-21: qty 1
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
## 2018-04-21 NOTE — ED Triage Notes (Signed)
Pt in stating he has not been able to urinate since yesterday, recently seen for the same and had a catheter placed, pt states he has gotten small amounts out and noted a purulent discharge from his penis this morning, also diarrhea for the last two weeks, no distress noted

## 2018-04-21 NOTE — Discharge Instructions (Signed)
You have a urinary tract infection.  Take antibiotic twice a day for the next week until it is completely gone.  Call the bladder doctor (phone number listed below) for follow-up and catheter removal.  Your potassium was somewhat low, I have attached information regarding foods that are high in potassium.  Please try to replace this at home as you can.  Return to the ER immediately if you have fever, nausea/vomiting, worsening abdominal pain.

## 2018-04-21 NOTE — ED Notes (Signed)
Bladder scan showed >999 mL

## 2018-04-24 LAB — URINE CULTURE: Culture: >=100000 — AB

## 2018-04-25 ENCOUNTER — Telehealth: Payer: Self-pay | Admitting: Emergency Medicine

## 2018-04-25 NOTE — Telephone Encounter (Signed)
Post ED Visit - Positive Culture Follow-up: Unsuccessful Patient Follow-up  Culture assessed and recommendations reviewed by:  []  Elenor Quinones, Pharm.D. []  Heide Guile, Pharm.D., BCPS AQ-ID []  Parks Neptune, Pharm.D., BCPS []  Alycia Rossetti, Pharm.D., BCPS [x]  [place], Pharm.D., BCPS, AAHIVP []  Legrand Como, Pharm.D., BCPS, AAHIVP []  Wynell Balloon, PharmD []  Vincenza Hews, PharmD, BCPS  Positive Urine culture  []  Patient discharged without antimicrobial prescription and treatment is now indicated [x]  Organism is resistant to prescribed ED discharge antimicrobial []  Patient with positive blood cultures  Ceftin 500 mg PO BID x 5 days, Jola Schmidt, MD  Unable to contact patient, letter will be sent to address on file  Georgina Peer 04/25/2018, 12:05 PM

## 2018-04-25 NOTE — Progress Notes (Signed)
ED Antimicrobial Stewardship Positive Culture Follow Up   Brian Morgan is an 73 y.o. male who presented to [facility] on 04/21/2018 with a chief complaint of  Chief Complaint  Patient presents with  . Dysuria  . Diarrhea    Recent Results (from the past 720 hour(s))  Urine culture     Status: Abnormal   Collection Time: 04/21/18 12:32 PM  Result Value Ref Range Status   Specimen Description URINE, CATHETERIZED  Final   Special Requests   Final    NONE Performed at [facility], [address], [place], [place] [address]    Culture (A)  Final    >=100,000 COLONIES/mL ESCHERICHIA COLI >=100,000 COLONIES/mL KLEBSIELLA PNEUMONIAE    Report Status 04/24/2018 FINAL  Final   Organism ID, Bacteria ESCHERICHIA COLI (A)  Final   Organism ID, Bacteria KLEBSIELLA PNEUMONIAE (A)  Final      Susceptibility   Escherichia coli - MIC*    AMPICILLIN 8 SENSITIVE Sensitive     CEFAZOLIN <=4 SENSITIVE Sensitive     CEFTRIAXONE <=1 SENSITIVE Sensitive     CIPROFLOXACIN <=0.25 SENSITIVE Sensitive     GENTAMICIN <=1 SENSITIVE Sensitive     IMIPENEM <=0.25 SENSITIVE Sensitive     NITROFURANTOIN 64 INTERMEDIATE Intermediate     TRIMETH/SULFA >=320 RESISTANT Resistant     AMPICILLIN/SULBACTAM <=2 SENSITIVE Sensitive     PIP/TAZO <=4 SENSITIVE Sensitive     Extended ESBL NEGATIVE Sensitive     * >=100,000 COLONIES/mL ESCHERICHIA COLI   Klebsiella pneumoniae - MIC*    AMPICILLIN RESISTANT Resistant     CEFAZOLIN <=4 SENSITIVE Sensitive     CEFTRIAXONE <=1 SENSITIVE Sensitive     CIPROFLOXACIN <=0.25 SENSITIVE Sensitive     GENTAMICIN <=1 SENSITIVE Sensitive     IMIPENEM <=0.25 SENSITIVE Sensitive     NITROFURANTOIN 128 RESISTANT Resistant     TRIMETH/SULFA <=20 SENSITIVE Sensitive     AMPICILLIN/SULBACTAM 8 SENSITIVE Sensitive     PIP/TAZO 16 SENSITIVE Sensitive     Extended ESBL NEGATIVE Sensitive     * >=100,000 COLONIES/mL KLEBSIELLA PNEUMONIAE    [x]  Treated with Bactrim,  organism resistant to prescribed antimicrobial (R to E Coli, S to Kleb) []  Patient discharged originally without antimicrobial agent and treatment is now indicated  New antibiotic prescription: Cefuroxime 500 mg PO twice daily for 5 days  ED Provider: Dr Albin Fischer 04/25/2018, 2:12 PM Clinical Pharmacist Monday - Friday phone -  [phone] Saturday - Sunday phone - [phone]

## 2018-05-01 ENCOUNTER — Emergency Department (HOSPITAL_COMMUNITY)
Admission: EM | Admit: 2018-05-01 | Discharge: 2018-05-01 | Disposition: A | Discharge status: Home / Self Care | Payer: 59 | Attending: Emergency Medicine | Admitting: Emergency Medicine

## 2018-05-01 ENCOUNTER — Emergency Department (HOSPITAL_COMMUNITY): Payer: 59

## 2018-05-01 ENCOUNTER — Encounter (HOSPITAL_COMMUNITY): Payer: Self-pay | Admitting: Emergency Medicine

## 2018-05-01 DIAGNOSIS — Z79899 Other long term (current) drug therapy: Secondary | ICD-10-CM | POA: Diagnosis not present

## 2018-05-01 DIAGNOSIS — R55 Syncope and collapse: Secondary | ICD-10-CM | POA: Diagnosis not present

## 2018-05-01 DIAGNOSIS — J45909 Unspecified asthma, uncomplicated: Secondary | ICD-10-CM | POA: Insufficient documentation

## 2018-05-01 DIAGNOSIS — N3001 Acute cystitis with hematuria: Secondary | ICD-10-CM | POA: Insufficient documentation

## 2018-05-01 DIAGNOSIS — Z87891 Personal history of nicotine dependence: Secondary | ICD-10-CM | POA: Insufficient documentation

## 2018-05-01 DIAGNOSIS — I10 Essential (primary) hypertension: Secondary | ICD-10-CM | POA: Diagnosis not present

## 2018-05-01 DIAGNOSIS — R339 Retention of urine, unspecified: Secondary | ICD-10-CM

## 2018-05-01 LAB — CBC WITH DIFFERENTIAL/PLATELET
Abs Immature Granulocytes: 0 10*3/uL (ref 0.0–0.1)
Basophils Absolute: 0.1 10*3/uL (ref 0.0–0.1)
Basophils Relative: 1 %
Eosinophils Absolute: 0.1 10*3/uL (ref 0.0–0.7)
Eosinophils Relative: 1 %
HCT: 39.7 % (ref 39.0–52.0)
Hemoglobin: 12.9 g/dL — ABNORMAL LOW (ref 13.0–17.0)
IMMATURE GRANULOCYTES: 0 %
Lymphocytes Relative: 13 %
Lymphs Abs: 1.4 10*3/uL (ref 0.7–4.0)
MCH: 31.7 pg (ref 26.0–34.0)
MCHC: 32.5 g/dL (ref 30.0–36.0)
MCV: 97.5 fL (ref 78.0–100.0)
MONO ABS: 0.6 10*3/uL (ref 0.1–1.0)
MONOS PCT: 6 %
NEUTROS ABS: 8.2 10*3/uL — AB (ref 1.7–7.7)
NEUTROS PCT: 79 %
Platelets: 328 10*3/uL (ref 150–400)
RBC: 4.07 MIL/uL — ABNORMAL LOW (ref 4.22–5.81)
RDW: 13.4 % (ref 11.5–15.5)
WBC: 10.3 10*3/uL (ref 4.0–10.5)

## 2018-05-01 LAB — URINALYSIS, ROUTINE W REFLEX MICROSCOPIC
BILIRUBIN URINE: NEGATIVE
GLUCOSE, UA: NEGATIVE mg/dL
Ketones, ur: NEGATIVE mg/dL
NITRITE: POSITIVE — AB
Protein, ur: NEGATIVE mg/dL
SPECIFIC GRAVITY, URINE: 1.009 (ref 1.005–1.030)
pH: 6 (ref 5.0–8.0)

## 2018-05-01 LAB — I-STAT TROPONIN, ED
Troponin i, poc: 0 ng/mL (ref 0.00–0.08)
Troponin i, poc: 0 ng/mL (ref 0.00–0.08)

## 2018-05-01 LAB — I-STAT CG4 LACTIC ACID, ED: LACTIC ACID, VENOUS: 1.46 mmol/L (ref 0.5–1.9)

## 2018-05-01 LAB — BASIC METABOLIC PANEL
Anion gap: 11 (ref 5–15)
BUN: 7 mg/dL — AB (ref 8–23)
CHLORIDE: 104 mmol/L (ref 98–111)
CO2: 23 mmol/L (ref 22–32)
CREATININE: 0.8 mg/dL (ref 0.61–1.24)
Calcium: 9.1 mg/dL (ref 8.9–10.3)
GFR calc Af Amer: >60 mL/min (ref >60)
GFR calc non Af Amer: >60 mL/min (ref >60)
GLUCOSE: 114 mg/dL — AB (ref 70–99)
POTASSIUM: 3.4 mmol/L — AB (ref 3.5–5.1)
SODIUM: 138 mmol/L (ref 135–145)

## 2018-05-01 MED ORDER — SODIUM CHLORIDE 0.9 % IV SOLN
1.0000 g | Freq: Once | INTRAVENOUS | Status: AC
  Administered 2018-05-01: 1 g via INTRAVENOUS
  Filled 2018-05-01: qty 10

## 2018-05-01 MED ORDER — CIPROFLOXACIN HCL 500 MG PO TABS: 500.0000 mg | ORAL_TABLET | Freq: Two times a day (BID) | ORAL | 0 refills | Status: DC

## 2018-05-01 MED ORDER — ACETAMINOPHEN 325 MG PO TABS
650.0000 mg | ORAL_TABLET | Freq: Once | ORAL | Status: AC
  Administered 2018-05-01: 650 mg via ORAL
  Filled 2018-05-01: qty 2

## 2018-05-01 MED ORDER — CIPROFLOXACIN HCL 500 MG PO TABS
500.0000 mg | ORAL_TABLET | Freq: Once | ORAL | Status: AC
  Administered 2018-05-01: 500 mg via ORAL
  Filled 2018-05-01: qty 1

## 2018-05-01 MED ORDER — SODIUM CHLORIDE 0.9 % IV BOLUS
1000.0000 mL | Freq: Once | INTRAVENOUS | Status: AC
  Administered 2018-05-01: 1000 mL via INTRAVENOUS

## 2018-05-01 MED FILL — CIPROFLOXACIN HCL 500 MG TA: 500 | 7 days supply | Qty: 14 | Fill #0

## 2018-05-01 NOTE — ED Notes (Signed)
Pt. Had trouble sliding over to the stretcher. Pt. Stated his left leg is his bad leg.

## 2018-05-01 NOTE — Discharge Planning (Signed)
EDCM consulted in regards to medication assistance.  Pt has insurance coverage and is not eligible for MATCH program.  No further CM needs communicated at this time.  

## 2018-05-01 NOTE — ED Notes (Signed)
Pt bladder scanned at >974mL. RN notified.

## 2018-05-01 NOTE — Discharge Instructions (Signed)
You are seen in the ER for chest pain and bladder pain.  Cardiac work-up today was reassuring.  You may have had a brief passing out episode from sitting up too quickly.  Stay well-hydrated.  Return to the ER for recurrent symptoms, chest pain or shortness of breath with exertion, lightheadedness, blood in your stools.  Follow-up with cardiology for further evaluation of this.  You have a urinary tract infection.  You had a lot of urine retained in your bladder.  Case management has assisted in obtaining your antibiotics this time, make sure that you take them completely.  Do not remove your Foley until you are evaluated by urology.  Call [facility] for reevaluation as soon as able.  Return to the ER for fevers, chills, worsening abdominal pain, retention of urine, pain in your back or flanks.

## 2018-05-01 NOTE — ED Notes (Signed)
PT switched to leg bag.

## 2018-05-01 NOTE — ED Notes (Signed)
Patient transported to X-ray 

## 2018-05-01 NOTE — ED Triage Notes (Signed)
Pt reports he has been unable to urinate since he removed his foley yesterday around 1pm.  Pt reports bladder pain and chest pain that he actually describes as tingling.  He was unable to fill his antibiotic and make the appointment for follow up.

## 2018-05-01 NOTE — Discharge Summary (Signed)
[facility] consulted regarding pt unable to afford co-pay for antibiotics.  EDCM accessed TOC petty cash to purchase Rx for pt.

## 2018-05-01 NOTE — Discharge Planning (Signed)
EDCM picked up Rx from [facility] and delivered to pt who was discharged to lobby.  No further EDCM needs identified at this time.

## 2018-05-01 NOTE — ED Notes (Signed)
1000 mL in foley bag, foley clamped per RN. Will reopen in 15-20 minutes.
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
## 2018-05-03 LAB — URINE CULTURE: Culture: >=100000 — AB

## 2018-05-04 ENCOUNTER — Telehealth: Payer: Self-pay

## 2018-05-04 NOTE — Telephone Encounter (Signed)
Post ED Visit - Positive Culture Follow-up  Culture report reviewed by antimicrobial stewardship pharmacist:  []  Elenor Quinones, Pharm.D. []  Heide Guile, Pharm.D., BCPS AQ-ID []  Parks Neptune, Pharm.D., BCPS []  Alycia Rossetti, Pharm.D., BCPS []  Francis, Pharm.D., BCPS, AAHIVP []  Legrand Como, Pharm.D., BCPS, AAHIVP []  Salome Arnt, PharmD, BCPS []  Johnnette Gourd, PharmD, BCPS []  Hughes Better, PharmD, BCPS []  Leeroy Cha, PharmD [facility] Pharm D Positive urine culture Treated with Ciprolfoxacin, organism sensitive to the same and no further patient follow-up is required at this time.  Genia Del 05/04/2018, 10:27 AM

## 2018-05-06 LAB — CULTURE, BLOOD (ROUTINE X 2)
CULTURE: NO GROWTH
Culture: NO GROWTH
Special Requests: ADEQUATE
Special Requests: ADEQUATE

## 2018-06-09 ENCOUNTER — Telehealth: Payer: Self-pay | Admitting: Emergency Medicine

## 2018-06-09 NOTE — Telephone Encounter (Signed)
Lost to followup 
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
## 2018-10-08 ENCOUNTER — Encounter: Payer: Self-pay | Admitting: Family Medicine

## 2018-10-08 ENCOUNTER — Ambulatory Visit (INDEPENDENT_AMBULATORY_CARE_PROVIDER_SITE_OTHER): Payer: 59 | Admitting: Family Medicine

## 2018-10-08 VITALS — BP 123/80 | HR 83 | Temp 98.2°F | Wt 142.4 lb

## 2018-10-08 DIAGNOSIS — H6121 Impacted cerumen, right ear: Secondary | ICD-10-CM

## 2018-10-08 DIAGNOSIS — Z1211 Encounter for screening for malignant neoplasm of colon: Secondary | ICD-10-CM

## 2018-10-08 DIAGNOSIS — Z23 Encounter for immunization: Secondary | ICD-10-CM | POA: Diagnosis not present

## 2018-10-08 DIAGNOSIS — M5430 Sciatica, unspecified side: Secondary | ICD-10-CM | POA: Diagnosis not present

## 2018-10-08 DIAGNOSIS — M1712 Unilateral primary osteoarthritis, left knee: Secondary | ICD-10-CM | POA: Diagnosis not present

## 2018-10-08 DIAGNOSIS — I10 Essential (primary) hypertension: Secondary | ICD-10-CM

## 2018-10-08 DIAGNOSIS — G8929 Other chronic pain: Secondary | ICD-10-CM

## 2018-10-08 DIAGNOSIS — M549 Dorsalgia, unspecified: Secondary | ICD-10-CM | POA: Diagnosis not present

## 2018-10-08 DIAGNOSIS — Z0000 Encounter for general adult medical examination without abnormal findings: Secondary | ICD-10-CM

## 2018-10-08 DIAGNOSIS — M25562 Pain in left knee: Secondary | ICD-10-CM

## 2018-10-08 DIAGNOSIS — Z716 Tobacco abuse counseling: Secondary | ICD-10-CM | POA: Insufficient documentation

## 2018-10-08 DIAGNOSIS — Z87898 Personal history of other specified conditions: Secondary | ICD-10-CM | POA: Insufficient documentation

## 2018-10-08 DIAGNOSIS — H6122 Impacted cerumen, left ear: Secondary | ICD-10-CM

## 2018-10-08 MED ORDER — GABAPENTIN 300 MG PO CAPS: 600.0000 mg | ORAL_CAPSULE | Freq: Two times a day (BID) | ORAL | 1 refills | Status: DC

## 2018-10-08 MED ORDER — LISINOPRIL 20 MG PO TABS: 20.0000 mg | ORAL_TABLET | Freq: Every morning | ORAL | 11 refills | Status: DC

## 2018-10-08 MED ORDER — CARBAMIDE PEROXIDE 6.5 % OT SOLN: 5.0000 [drp] | Freq: Two times a day (BID) | OTIC | 0 refills | Status: DC

## 2018-10-08 MED ORDER — PNEUMOCOCCAL VAC POLYVALENT 25 MCG/0.5ML IJ INJ: 0.5000 mL | INJECTION | INTRAMUSCULAR | Status: DC

## 2018-10-08 NOTE — Assessment & Plan Note (Deleted)
   Exam limited during today's visit.   Left knee with noticeable effusion. Per patient, this is chronic. No concern for infection at this time.  Per patient request, will send referral to Sports Medicine as patient has been seen by Dr. Nori Riis in the past.  Will obtain x-ray of left knee since last imaging was in 2013  Patient to RTC in 2 weeks for further evaluation  Recommend tylenol/ibuprofen for pain at this time

## 2018-10-08 NOTE — Assessment & Plan Note (Signed)
   BP normotensive today - 123/80  Patient has been taking Norvasc daily. Last use of Lisinopril was 2 weeks ago.  Denies hypertensive/hypotensive symptoms.   Recommended continuing Lisinopril 20mg  at this time given kidney protection. Will hold Norvasc.   Patient to return in 2 week for follow up visit. Will determine if need to cut back on Lisinopril or restart Norvasc.

## 2018-10-08 NOTE — Assessment & Plan Note (Addendum)
   Last colonoscopy in 2014 positive for benign polyps. Patient refuses colonoscopy. Given history of polyps, not a candidate of Cologuard. Patient agrees to the FIT FOBT testing  Patient received Flu and PNA vaccine today

## 2018-10-08 NOTE — Assessment & Plan Note (Addendum)
   Noted per chart review. Has had several UTI's this past year and has required foley catheterization for short time. Patient never followed up with urology referral   Currently asymptomatic  Will continue to monitor

## 2018-10-08 NOTE — Assessment & Plan Note (Addendum)
   Exam limited during today's visit  Per chart review, was last seen at Sports medicine clinic in 2015 in which received steroid injections which were helpful.   Left knee with noticeable effusion. Per patient, this is chronic. No concern for infection at this time.  Per patient request, will send referral to Sports Medicine  Will obtain x-ray of left knee since last imaging was in 2013  Patient to RTC in 2 weeks for further evaluation  Recommend tylenol/ibuprofen for pain at this time. Can consider Voltaren gel, lidocaine patch, and/or tramadol if needs something prior to appointment.

## 2018-10-08 NOTE — Assessment & Plan Note (Addendum)
   Has taken Gabapentin and Tramadol in past. Felt this was inadequate thus referred to pain specialist in [place], [place]  Reports currently taking and request refills of Percocet/Oxycodone 10mg  QID and Morphine 10mg  TID  However denies taking anything for pain in a while. Uses 2 shots of Bourbon nightly to help ease pain  [place] PDMP reviewed. Patient has a risk score of 440 out of 999. Last Percocet 5-325 received was in July 2019 - he received 8 total at that time. Prior to that last Rx was in December 2018. Last morphine received was in 2017. Per PDMP patient has received Buprenorphine in the past.   Will hold off on prescribing narcotics at this time. Will refer to sports medicine for further evaluation and recommendations.  RTC in 2 weeks to follow up and further evaluate pain needs  Refill Gabapentin 600mg  BID

## 2018-10-08 NOTE — Assessment & Plan Note (Addendum)
   Patient open to cutting back.  Patient plans to call and obtain nicotine gum  Goal until follow up is to cut down to 7 cigarettes/day. He notes he is 90% confident he can achieve this.  Will continue to provide smoking cessation counseling and guidance.

## 2018-10-08 NOTE — Patient Instructions (Addendum)
Thank you for coming in today.  Please make a sport medicine appointment at earliest convenience.  Please come back to see me to discuss concerns and recheck blood pressure in 2 weeks. Please come back sooner if you begin to experience light headedness or low blood pressure symptoms.  If you have any questions or concerns, please do not hesitate to call the office at [phone].  Take Care,  Dr. Mina Marble, DO Resident Physician [place] [phone]   What are the signs or symptoms of Low Blood Pressure? Common symptoms of this condition include:  Weakness.  Light-headedness.  Dizziness.  Blurred vision.  Fatigue.  Rapid heartbeat.  Fainting, in severe cases.

## 2018-10-08 NOTE — Assessment & Plan Note (Addendum)
   Cerumen covering entire TM of left ear. Denies symptoms other than occasional hard of hearing  Recommend Debrox to help break up cerumen.

## 2018-10-09 ENCOUNTER — Telehealth: Payer: Self-pay

## 2018-10-09 NOTE — Telephone Encounter (Signed)
Attempted to call patient to give him instructions from Dr. Angelia Mould.   Patient's address needs verification so that a FIT test can be mailed to him. He is not able to do the [facility] guard because of a history of polyps. He will need to return the FIT testing to the office lab at his convenience.   Patient is to get a repeat xray of left knee. He can take ibuprofen/ tylenol for pain until follow up visit.  Debrox has been sent  to his pharmacy for his right ear. He will need to place 5 drops in his right ear twice a day until his follow up visit.  Ozella Almond, [place]

## 2018-10-09 NOTE — Telephone Encounter (Signed)
Have attempted on several occassions at different times of the day to contact patient concerning FIT testing, verification of his address, ear drops, and xray.  No answer but a generic voice mail was left.  Ozella Almond, [place]

## 2018-10-14 ENCOUNTER — Telehealth: Payer: Self-pay

## 2018-10-14 NOTE — Telephone Encounter (Signed)
Attempted to call patient once again to no avail. Tried spouse's number also listed in chart.  No answer.  A letter with instructions on FIT testing, verification of address, ear drops, and repeat xray of left knee should be mailed to patient.  When letter is completed by Dr. Tarry Kos, send message to [facility] Team and we will mail it out.  Ozella Almond, [place]

## 2018-10-14 NOTE — Telephone Encounter (Signed)
Pt wife returned called. Informed her of the information below and confirmed that the address in the chart is up to date.

## 2018-10-24 ENCOUNTER — Encounter: Payer: Self-pay | Admitting: Family Medicine

## 2018-10-24 ENCOUNTER — Ambulatory Visit: Payer: Self-pay

## 2018-10-24 ENCOUNTER — Other Ambulatory Visit: Payer: Self-pay | Admitting: Family Medicine

## 2018-10-24 ENCOUNTER — Ambulatory Visit (INDEPENDENT_AMBULATORY_CARE_PROVIDER_SITE_OTHER): Payer: 59 | Admitting: Family Medicine

## 2018-10-24 ENCOUNTER — Other Ambulatory Visit: Payer: Self-pay

## 2018-10-24 VITALS — BP 134/90 | Ht 72.0 in | Wt 143.0 lb

## 2018-10-24 DIAGNOSIS — M150 Primary generalized (osteo)arthritis: Principal | ICD-10-CM

## 2018-10-24 DIAGNOSIS — M159 Polyosteoarthritis, unspecified: Secondary | ICD-10-CM

## 2018-10-24 DIAGNOSIS — M25462 Effusion, left knee: Secondary | ICD-10-CM | POA: Diagnosis not present

## 2018-10-24 DIAGNOSIS — G5603 Carpal tunnel syndrome, bilateral upper limbs: Secondary | ICD-10-CM

## 2018-10-24 DIAGNOSIS — M25562 Pain in left knee: Secondary | ICD-10-CM

## 2018-10-24 DIAGNOSIS — M5430 Sciatica, unspecified side: Secondary | ICD-10-CM

## 2018-10-24 MED ORDER — METHYLPREDNISOLONE ACETATE 80 MG/ML IJ SUSP
80.0000 mg | Freq: Once | INTRAMUSCULAR | Status: AC
  Administered 2018-10-24: 80 mg via INTRAMUSCULAR

## 2018-10-24 MED ORDER — TRAMADOL HCL 50 MG PO TABS: ORAL_TABLET | ORAL | 2 refills | Status: DC

## 2018-10-24 MED ORDER — OXYCODONE-ACETAMINOPHEN 10-325 MG PO TABS: ORAL_TABLET | ORAL | 0 refills | Status: DC

## 2018-10-24 NOTE — Progress Notes (Signed)
  Brian Morgan - 73 y.o. male MRN [id number]  Date of birth: [birth year]    SUBJECTIVE:      Chief Complaint:/ HPI:  Left knee pain and swelling with difficulty walking, difficulty straightening his leg. In November 2018 he had septic joint and was in the hospital for more than a week.  This was at an outside hospital we have no records.  Since then he has had chronic knee pain and swelling.  He has been trying to see if it would get better but realizes at this point that it is not going to get better and he is ready to consider surgical intervention.  It hurts so much that he is not able to do anything.  He can barely do his ADLs.  His wife helps him.  He is using a walker full-time.  He is sleeping in a recliner because he cannot straighten his leg out at night.   ROS:     No recent fever, no cough, no unusual redness around the area of the left knee.  PERTINENT  PMH / PSH FH / / SH:  Past Medical, Surgical, Social, and Family History Reviewed & Updated in the EMR.  Pertinent findings include:  Hospitalization for what sounds like septic arthritis of the left knee in 2018  OBJECTIVE: BP 134/90   Ht 6' (1.829 m)   Wt 143 lb (64.9 kg)   BMI 19.39 kg/m   Physical Exam:  Vital signs are reviewed. GENERAL: Well-developed male, using a walker.  Somewhat thin. KNEE: Left.  Extremely swollen/deformed with apparent synovial hypertrophy and effusion.  He cannot fully extend lacking about 30 degrees.  He cannot fully flex lacking about 20 degrees.  He is extremely tender to touch anywhere around the knee area. MSK: Starting to get some left calf atrophy compared with the other side.  Bilaterally calves are soft. VASCULAR, dorsalis pedis pulses 2+ bilaterally symmetrical. NEURO: Intact sensation to soft touch bilateral lower extremities  ULTRASOUND and ultrasound-guided aspiration/injection: Patient was given informed consent, signed copy in the chart.  Area was prepped in the usual sterile  fashion.  Ultrasound revealed knee effusion and synovial hypertrophy.  There was some debris noted in the suprapatellar pouch which extended medially.  3 cc of 1% lidocaine were used to anesthetize the skin.   Using ultrasound guidance, the area of effusion was entered with an 18-gauge needle and approximately 30 cc of straw-colored clear fluid was aspirated.  Using a stopcock instrumentation, we then injected 1 cc of 80 mg/mL methylprednisolone combined with 4 cc of 1% lidocaine without epinephrine into the knee joint.  All equipment was removed.  Patient tolerated procedure well.  ASSESSMENT & PLAN:  See problem based charting & AVS for pt instructions.

## 2018-10-24 NOTE — Patient Instructions (Addendum)
We placed a referral for you to see Dr. Fredonia Highland to discuss surgery for your left knee. [place] [facility]  Appt: Monday 10/27/2018 @ 9:30 am.  *You need to call [facility] that's on your medicaid card and ask them to send a referral to Raliegh Ip prior to your visit on Monday. They can fax it to [phone].

## 2018-10-24 NOTE — Addendum Note (Signed)
Addended by: Jolinda Croak E on: 10/24/2018 01:44 PM   Modules accepted: Orders

## 2018-10-24 NOTE — Addendum Note (Signed)
Addended by: Jolinda Croak E on: 10/24/2018 11:09 AM   Modules accepted: Orders

## 2018-10-24 NOTE — Assessment & Plan Note (Addendum)
Reported history of septic arthritis left knee 2018 He has chronic deformity now of the knee and is not have normal range of motion.  He is having to use a walker.  Today under ultrasound guidance we aspirated and injected with Steroid.  I placed him back on tramadol for pain.  I will also give him referral to pain management as well as to orthopedic surgery as he will need total knee replacement in the very near future.

## 2018-10-29 ENCOUNTER — Ambulatory Visit: Payer: 59 | Admitting: Family Medicine

## 2018-11-03 ENCOUNTER — Telehealth: Payer: Self-pay

## 2018-11-04 ENCOUNTER — Other Ambulatory Visit: Payer: Self-pay | Admitting: Family Medicine

## 2018-11-04 DIAGNOSIS — M5430 Sciatica, unspecified side: Secondary | ICD-10-CM

## 2018-11-04 DIAGNOSIS — G8929 Other chronic pain: Secondary | ICD-10-CM

## 2018-11-04 DIAGNOSIS — M549 Dorsalgia, unspecified: Principal | ICD-10-CM

## 2018-11-04 MED ORDER — TRAMADOL HCL 50 MG PO TABS: ORAL_TABLET | ORAL | 2 refills | Status: DC

## 2018-11-06 NOTE — Telephone Encounter (Signed)
See phone note

## 2018-11-14 NOTE — Progress Notes (Signed)
  Coronavirus Screening  Have you experienced the following symptoms:  Cough yes/no: No Fever (>100.1F)  yes/no: No Runny nose yes/no: No Sore throat yes/no: No Difficulty breathing/shortness of breath  yes/no: Yes patient has asthma and COPD  Have you or a family member traveled in the last 14 days and where? yes/no: No   If the patient indicates "YES" to the above questions, their PAT will be rescheduled to limit the exposure to others and, the surgeon will be notified. THE PATIENT WILL NEED TO BE ASYMPTOMATIC FOR 14 DAYS.   If the patient is not experiencing any of these symptoms, the PAT nurse will instruct them to NOT bring anyone with them to their appointment since they may have these symptoms or traveled as well.   Please remind your patients and families that hospital visitation restrictions are in effect and the importance of the restrictions.

## 2018-11-14 NOTE — Progress Notes (Signed)
Patient is unable to come for Pre-op visit on Monday he will need to be a same day workup for his surgery for Tuesday.  Spoke with patient on Friday about this.

## 2018-11-17 ENCOUNTER — Inpatient Hospital Stay (HOSPITAL_COMMUNITY)
Admission: RE | Admit: 2018-11-17 | Discharge: 2018-11-17 | Disposition: A | Discharge status: Home / Self Care | Payer: 59 | Source: Ambulatory Visit

## 2018-11-17 NOTE — Consult Note (Signed)
Culture of knee from my office on 11/12/18   Lab Supplied Demographics:     Brian Morgan  [id number]  [facility]    DOB/Age  [birth year]  72y  Sex  M    [place]  [id number]    [place] Supplied Demographics:     Brian Morgan  [id number]    DOB/Age  10-Jan-1946  72y  Sex  M         Provider Renette Butters MD Report Status F      Ordered 11/12/2018    Collected 11/12/2018    Received 11/12/2018    Reported  11/13/2018    Requisition  [id number]               Test Name Result Flag Units Ref Range Status  CELL COUNT AND DIFF, SYNOVIAL FLUID   SITE LEFT KNEE    F   COLOR PALE YELLOW A  STRAW/YELLOW F   APPEARANCE TURBID A  CLEAR/HAZY F   TOTAL NUCLEATED CELL CT 99357 H cells/uL <150 F   NEUTROPHILS, % 92 H % 0-24 F   LYMPHOCYTES, % 3 N % 0-74 F   MONOCYTE/MACROPHAGE, % 5 N % 0-69 F   EOSINOPHILS, % 0 N % 0-2 F   BASOPHILS, % 0 N % 0 F   SYNOVIOCYTES, % 0 N % 0-15 F   COMMENT DNR N   F            Test Name Result Flag Units Ref Range Status  CULTURE, AEROBIC AND ANAEROBIC W/GRAM STAIN   CULTURE, ANAEROBIC BACTERIA W/GRAM  SEE NOTE    P   CULTURE, ANAEROBIC BACTERIA W/GRAM STAIN       Micro Number: [id number]   Test Status: Preliminary   Specimen Source: KNEE, LEFT   Specimen Quality: Adequate   Gram Stain: Many Polymorphonuclear leukocytes   No organisms seen   Result: No anaerobes isolated to date, continuing   incubation.   CULTURE, AEROBIC BACTERIA SEE NOTE A   F   CULTURE, AEROBIC BACTERIA       Micro Number: [id number]   Test Status: Final   Specimen Source: KNEE, LEFT   Specimen Quality: Adequate   Result: Light growth of Pseudomonas aeruginosa   Ps.aeruginosa   ----------------   INT MIC   CEFEPIME S 4   CEFTAZIDIME S 4   CIPROFLOXACIN S <=0.25   GENTAMICIN S <=1   IMIPENEM S 1   LEVOFLOXACIN S 1   PIP/TAZOBACTAM I 16   TOBRAMYCIN S <=1   S=Susceptible I=Intermediate R=Resistant * = Not Tested   NR = Not Reported **NN  = See Therapy Comments            Test Name Result Flag Units Ref Range Status  CRYSTALS, SYNOVIAL FLUID   SITE LEFT KNEE    F   CRYSTALS, SYNOVIAL FLUID  N /HPF NONE SEEN F   No crystals found

## 2018-11-17 NOTE — H&P (Signed)
[facility] [address] [place], [place] [address] [phone]  RE: Brian, Morgan                                  [id number]         DOB: [birth year] PROGRESS REPORT 11/12/2018  Reason for visit:  Left knee pain.   HPI: He is 73 years old.  He has a traumatic history of an injury to his knee in the 1980's.  He has never had surgery on it per his report.  He was treated in September 2018 in [place], [place] for an infected knee.  He reports he did not have surgery, but had IV antibiotics and was hospitalized.  He has pain at his left knee.  Recent aspiration and injection was done by Dr. Nori Riis and he was given Tramadol.    OBJECTIVE: The patient is a well appearing male, in no apparent distress.  He has effusion of his left knee that is painful.  No erythema.  Tenderness at his medial joint line.  He is neurovascularly intact.    IMAGES: X-rays show severe arthritis to the left knee.    ASSESSMENT/PLAN:  Severe left knee arthritis.  Given his history of infection and recurrent infusion I would like to aspirate his knee again.  We will send this to the lab for culture.  If it is infected this would be a very chronic infection at this point and for now would preclude any surgery other than a washout.  We will wait for these culture results.     Ernesta Amble.  Percell Miller, M.D.  Electronically verified by Ernesta Amble. Percell Miller, M.D. TDM:pmw Cc:  [facility]  fax [phone]  Cc:  Dorcas Mcmurray MD  fax [phone]  D 11/12/18 T 11/17/18  [facility] [address] [place] [employer], [place] [address] [phone]  RE: Brian, Morgan                                  [id number]         DOB: 1945-10-25 PHONE REPORT 11/17/2018  His knee aspiration came back with 95,000 white cells and positive for pseudomonas on culture.  I had a discussion with him on Friday.  He has a chronically infected  left knee.  I recommend an urgent arthroscopic synovectomy, significant debridement, possible meniscectomy, possible chondroplasty followed by inpatient admission for placement of PICC line and Infectious Disease consult.  I would also like to obtain an MRI of his knee given the chronic nature of his infection to evaluate for osteomyelitis prior to surgery.   Ernesta Amble.  Percell Miller, M.D.  Electronically verified by Ernesta Amble. Percell Miller, M.D. TDM:pmw D 11/17/18 T 11/17/18

## 2018-11-19 ENCOUNTER — Telehealth: Payer: Self-pay | Admitting: Family Medicine

## 2018-11-19 NOTE — Telephone Encounter (Signed)
Received fax for preop clearance for left total knee replacement.  However upon chart review it appears patient has acute on chronic infection with Pseudomonas on culture.  Patient followed by Dr. Edmonia [facility]. Patient scheduled to obtain urgent arthroscopic synovectomy, significant debridement, possible meniscectomy, possible chondroplasty followed by inpatient admission for placement of PICC line and Infectious Disease consult. MRI to evaluate for osteomyelitis prior to surgery. Spoke to the office of [facility] and at this time they have postponed the idea of total knee replacement given urgency of needed procedure.  They plan to obtain more imaging and labs this following week.  If they do go through with the knee replacement they will most likely refer to [facility] for the surgery.  At this time they informed me they do not need anything from me. I will plan to have patient come in for a more thorough yearly physical after he recovers from the procedure and when there is less viral risk in the community. Will continue to follow patient closely.

## 2018-11-24 ENCOUNTER — Encounter (HOSPITAL_COMMUNITY): Payer: Self-pay

## 2018-11-24 ENCOUNTER — Other Ambulatory Visit: Payer: Self-pay

## 2018-11-24 NOTE — Progress Notes (Signed)
PCP - Dr. Alyssa Grove  Cardiologist - Denies  Chest x-ray - 05/01/18 (E)  EKG - 05/01/18 (E)  Stress Test - Denies  ECHO - Denies  Cardiac Cath - Denies  AICD-na PM-na LOOP-na  Sleep Study - Denies CPAP - None  LABS- 11/25/2018: CBC, CMP, PCR  ASA- Denies    Anesthesia- No  Pt denies having chest pain, sob, or fever during the pre-op phone call. All instructions explained to the pt, with a verbal understanding of the material, including: Nothing to eat after midnight, but pt may have Clear Liquids until 4:30AM the day of surgery. Also, as of today, stop taking all Other Aspirin Products, Vitamins, Fish oils, and Herbal medications. Also stop all NSAIDS i.e. Advil, Ibuprofen, Motrin, Aleve, Anaprox, Naproxen, BC, Goody Powders, and all Supplements. The opportunity to ask questions was provided.

## 2018-11-24 NOTE — Progress Notes (Signed)

## 2018-11-24 NOTE — Anesthesia Preprocedure Evaluation (Addendum)
Anesthesia Evaluation  Patient identified by MRN, date of birth, ID band Patient awake    Reviewed: Allergy & Precautions, H&P , NPO status , Patient's Chart, lab work & pertinent test results  Airway Mallampati: II  TM Distance: >3 FB Neck ROM: Full    Dental no notable dental hx. (+) Upper Dentures, Partial Lower, Dental Advisory Given   Pulmonary asthma , COPD,  COPD inhaler, Current Smoker,    Pulmonary exam normal breath sounds clear to auscultation       Cardiovascular Exercise Tolerance: Good hypertension, Pt. on medications  Rhythm:Regular Rate:Normal     Neuro/Psych negative neurological ROS  negative psych ROS   GI/Hepatic negative GI ROS, Neg liver ROS,   Endo/Other  negative endocrine ROS  Renal/GU negative Renal ROS  negative genitourinary   Musculoskeletal  (+) Arthritis , Osteoarthritis,    Abdominal   Peds  Hematology negative hematology ROS (+)   Anesthesia Other Findings   Reproductive/Obstetrics negative OB ROS                            Anesthesia Physical Anesthesia Plan  ASA: III  Anesthesia Plan: General   Post-op Pain Management:    Induction: Intravenous  PONV Risk Score and Plan: 2 and Ondansetron and Dexamethasone  Airway Management Planned: Oral ETT  Additional Equipment:   Intra-op Plan:   Post-operative Plan: Extubation in OR  Informed Consent: I have reviewed the patients History and Physical, chart, labs and discussed the procedure including the risks, benefits and alternatives for the proposed anesthesia with the patient or authorized representative who has indicated his/her understanding and acceptance.     Dental advisory given  Plan Discussed with: CRNA  Anesthesia Plan Comments:         Anesthesia Quick Evaluation

## 2018-11-25 ENCOUNTER — Other Ambulatory Visit: Payer: Self-pay

## 2018-11-25 ENCOUNTER — Inpatient Hospital Stay (HOSPITAL_COMMUNITY): Payer: 59 | Admitting: Anesthesiology

## 2018-11-25 ENCOUNTER — Inpatient Hospital Stay (HOSPITAL_COMMUNITY)
Admission: RE | Admit: 2018-11-25 | Discharge: 2018-11-28 | DRG: 486 | Disposition: A | Discharge status: Home / Self Care | Payer: 59 | Attending: Orthopedic Surgery | Admitting: Orthopedic Surgery

## 2018-11-25 ENCOUNTER — Encounter (HOSPITAL_COMMUNITY): Payer: Self-pay

## 2018-11-25 ENCOUNTER — Encounter (HOSPITAL_COMMUNITY)
Admission: RE | Disposition: A | Discharge status: Home / Self Care | Payer: Self-pay | Source: Home / Self Care | Attending: Orthopedic Surgery

## 2018-11-25 DIAGNOSIS — M1712 Unilateral primary osteoarthritis, left knee: Secondary | ICD-10-CM | POA: Diagnosis present

## 2018-11-25 DIAGNOSIS — F17210 Nicotine dependence, cigarettes, uncomplicated: Secondary | ICD-10-CM | POA: Diagnosis present

## 2018-11-25 DIAGNOSIS — J449 Chronic obstructive pulmonary disease, unspecified: Secondary | ICD-10-CM | POA: Diagnosis present

## 2018-11-25 DIAGNOSIS — Z8249 Family history of ischemic heart disease and other diseases of the circulatory system: Secondary | ICD-10-CM

## 2018-11-25 DIAGNOSIS — I10 Essential (primary) hypertension: Secondary | ICD-10-CM | POA: Diagnosis present

## 2018-11-25 DIAGNOSIS — Z880 Allergy status to penicillin: Secondary | ICD-10-CM | POA: Diagnosis not present

## 2018-11-25 DIAGNOSIS — M009 Pyogenic arthritis, unspecified: Secondary | ICD-10-CM | POA: Diagnosis present

## 2018-11-25 DIAGNOSIS — R079 Chest pain, unspecified: Secondary | ICD-10-CM | POA: Diagnosis not present

## 2018-11-25 DIAGNOSIS — M8668 Other chronic osteomyelitis, other site: Secondary | ICD-10-CM | POA: Diagnosis present

## 2018-11-25 DIAGNOSIS — R001 Bradycardia, unspecified: Secondary | ICD-10-CM

## 2018-11-25 DIAGNOSIS — G8929 Other chronic pain: Secondary | ICD-10-CM | POA: Diagnosis not present

## 2018-11-25 DIAGNOSIS — F419 Anxiety disorder, unspecified: Secondary | ICD-10-CM | POA: Diagnosis present

## 2018-11-25 DIAGNOSIS — Z91018 Allergy to other foods: Secondary | ICD-10-CM

## 2018-11-25 DIAGNOSIS — Z825 Family history of asthma and other chronic lower respiratory diseases: Secondary | ICD-10-CM | POA: Diagnosis not present

## 2018-11-25 DIAGNOSIS — M00862 Arthritis due to other bacteria, left knee: Principal | ICD-10-CM | POA: Diagnosis present

## 2018-11-25 DIAGNOSIS — Z87442 Personal history of urinary calculi: Secondary | ICD-10-CM

## 2018-11-25 DIAGNOSIS — Z886 Allergy status to analgesic agent status: Secondary | ICD-10-CM | POA: Diagnosis not present

## 2018-11-25 DIAGNOSIS — B965 Pseudomonas (aeruginosa) (mallei) (pseudomallei) as the cause of diseases classified elsewhere: Secondary | ICD-10-CM | POA: Diagnosis present

## 2018-11-25 DIAGNOSIS — M549 Dorsalgia, unspecified: Secondary | ICD-10-CM

## 2018-11-25 DIAGNOSIS — M25562 Pain in left knee: Secondary | ICD-10-CM | POA: Diagnosis present

## 2018-11-25 HISTORY — PX: KNEE ARTHROSCOPY: SHX127

## 2018-11-25 HISTORY — DX: Personal history of urinary calculi: Z87.442

## 2018-11-25 LAB — COMPREHENSIVE METABOLIC PANEL
ALT: 27 U/L (ref 0–44)
AST: 46 U/L — ABNORMAL HIGH (ref 15–41)
Albumin: 3 g/dL — ABNORMAL LOW (ref 3.5–5.0)
Alkaline Phosphatase: 46 U/L (ref 38–126)
Anion gap: 12 (ref 5–15)
BUN: 8 mg/dL (ref 8–23)
CO2: 24 mmol/L (ref 22–32)
Calcium: 8.8 mg/dL — ABNORMAL LOW (ref 8.9–10.3)
Chloride: 98 mmol/L (ref 98–111)
Creatinine, Ser: 0.92 mg/dL (ref 0.61–1.24)
GFR calc Af Amer: >60 mL/min (ref >60)
GFR calc non Af Amer: >60 mL/min (ref >60)
Glucose, Bld: 107 mg/dL — ABNORMAL HIGH (ref 70–99)
Potassium: 3.5 mmol/L (ref 3.5–5.1)
Sodium: 134 mmol/L — ABNORMAL LOW (ref 135–145)
Total Bilirubin: 1.1 mg/dL (ref 0.3–1.2)
Total Protein: 7.4 g/dL (ref 6.5–8.1)

## 2018-11-25 LAB — CBC
HCT: 46.2 % (ref 39.0–52.0)
Hemoglobin: 15.2 g/dL (ref 13.0–17.0)
MCH: 32.5 pg (ref 26.0–34.0)
MCHC: 32.9 g/dL (ref 30.0–36.0)
MCV: 98.9 fL (ref 80.0–100.0)
Platelets: 318 10*3/uL (ref 150–400)
RBC: 4.67 MIL/uL (ref 4.22–5.81)
RDW: 13 % (ref 11.5–15.5)
WBC: 8.8 10*3/uL (ref 4.0–10.5)
nRBC: 0 % (ref 0.0–0.2)

## 2018-11-25 LAB — SYNOVIAL CELL COUNT + DIFF, W/ CRYSTALS
Crystals, Fluid: NONE SEEN
Eosinophils-Synovial: 0 % (ref 0–1)
Lymphocytes-Synovial Fld: 5 % (ref 0–20)
Monocyte-Macrophage-Synovial Fluid: 0 % — ABNORMAL LOW (ref 50–90)
Neutrophil, Synovial: 95 % — ABNORMAL HIGH (ref 0–25)
WBC, Synovial: 232500 /mm3 — ABNORMAL HIGH (ref 0–200)

## 2018-11-25 LAB — SURGICAL PCR SCREEN
MRSA, PCR: NEGATIVE
Staphylococcus aureus: NEGATIVE

## 2018-11-25 SURGERY — ARTHROSCOPY, KNEE
Anesthesia: General | Site: Knee | Laterality: Left

## 2018-11-25 MED ORDER — DEXAMETHASONE SODIUM PHOSPHATE 10 MG/ML IJ SOLN
INTRAMUSCULAR | Status: DC | PRN
  Administered 2018-11-25: 10 mg via INTRAVENOUS

## 2018-11-25 MED ORDER — ONDANSETRON HCL 4 MG/2ML IJ SOLN
INTRAMUSCULAR | Status: AC
  Filled 2018-11-25: qty 2

## 2018-11-25 MED ORDER — BUPIVACAINE-EPINEPHRINE (PF) 0.5% -1:200000 IJ SOLN
INTRAMUSCULAR | Status: AC
  Filled 2018-11-25: qty 30

## 2018-11-25 MED ORDER — ONDANSETRON HCL 4 MG PO TABS: 4.0000 mg | ORAL_TABLET | Freq: Four times a day (QID) | ORAL | Status: DC | PRN

## 2018-11-25 MED ORDER — FENTANYL CITRATE (PF) 250 MCG/5ML IJ SOLN
INTRAMUSCULAR | Status: AC
  Filled 2018-11-25: qty 5

## 2018-11-25 MED ORDER — METHYLPREDNISOLONE ACETATE 80 MG/ML IJ SUSP
INTRAMUSCULAR | Status: AC
  Filled 2018-11-25: qty 1

## 2018-11-25 MED ORDER — FENTANYL CITRATE (PF) 100 MCG/2ML IJ SOLN
INTRAMUSCULAR | Status: DC | PRN
  Administered 2018-11-25: 50 ug via INTRAVENOUS
  Administered 2018-11-25: 100 ug via INTRAVENOUS
  Administered 2018-11-25 (×2): 50 ug via INTRAVENOUS

## 2018-11-25 MED ORDER — OXYCODONE HCL 5 MG PO TABS
ORAL_TABLET | ORAL | Status: AC
  Filled 2018-11-25: qty 2

## 2018-11-25 MED ORDER — ACETAMINOPHEN 325 MG PO TABS
325.0000 mg | ORAL_TABLET | Freq: Four times a day (QID) | ORAL | Status: DC | PRN
  Administered 2018-11-27 – 2018-11-28 (×2): 650 mg via ORAL
  Filled 2018-11-25 (×2): qty 2

## 2018-11-25 MED ORDER — CALCIUM CARBONATE ANTACID 500 MG PO CHEW
4.0000 | CHEWABLE_TABLET | Freq: Every day | ORAL | Status: DC | PRN
  Filled 2018-11-25: qty 4

## 2018-11-25 MED ORDER — MIDAZOLAM HCL 2 MG/2ML IJ SOLN
INTRAMUSCULAR | Status: AC
  Filled 2018-11-25: qty 2

## 2018-11-25 MED ORDER — ONDANSETRON HCL 4 MG PO TABS: 4.0000 mg | ORAL_TABLET | Freq: Three times a day (TID) | ORAL | 0 refills | Status: DC | PRN

## 2018-11-25 MED ORDER — KETOROLAC TROMETHAMINE 15 MG/ML IJ SOLN
7.5000 mg | Freq: Four times a day (QID) | INTRAMUSCULAR | Status: DC
  Administered 2018-11-25: 7.5 mg via INTRAVENOUS

## 2018-11-25 MED ORDER — PROPOFOL 10 MG/ML IV BOLUS
INTRAVENOUS | Status: AC
  Filled 2018-11-25: qty 20

## 2018-11-25 MED ORDER — METHYLPREDNISOLONE ACETATE 80 MG/ML IJ SUSP
INTRAMUSCULAR | Status: DC | PRN
  Administered 2018-11-25: 80 mg

## 2018-11-25 MED ORDER — SUCCINYLCHOLINE CHLORIDE 20 MG/ML IJ SOLN
INTRAMUSCULAR | Status: DC | PRN
  Administered 2018-11-25: 100 mg via INTRAVENOUS

## 2018-11-25 MED ORDER — LACTATED RINGERS IV SOLN
INTRAVENOUS | Status: DC
  Administered 2018-11-25 (×2): via INTRAVENOUS

## 2018-11-25 MED ORDER — MIDAZOLAM HCL 5 MG/5ML IJ SOLN
INTRAMUSCULAR | Status: DC | PRN
  Administered 2018-11-25: 2 mg via INTRAVENOUS

## 2018-11-25 MED ORDER — METHOCARBAMOL 500 MG PO TABS
500.0000 mg | ORAL_TABLET | Freq: Four times a day (QID) | ORAL | Status: DC | PRN
  Administered 2018-11-25: 500 mg via ORAL

## 2018-11-25 MED ORDER — SUCCINYLCHOLINE CHLORIDE 200 MG/10ML IV SOSY
PREFILLED_SYRINGE | INTRAVENOUS | Status: AC
  Filled 2018-11-25: qty 10

## 2018-11-25 MED ORDER — ACETAMINOPHEN 500 MG PO TABS
1000.0000 mg | ORAL_TABLET | Freq: Three times a day (TID) | ORAL | Status: AC
  Administered 2018-11-25 – 2018-11-27 (×6): 1000 mg via ORAL
  Filled 2018-11-25 (×6): qty 2

## 2018-11-25 MED ORDER — CHLORHEXIDINE GLUCONATE 4 % EX LIQD: 60.0000 mL | Freq: Once | CUTANEOUS | Status: DC

## 2018-11-25 MED ORDER — ACETAMINOPHEN 500 MG PO TABS: 1000.0000 mg | ORAL_TABLET | Freq: Once | ORAL | Status: DC

## 2018-11-25 MED ORDER — CLINDAMYCIN PHOSPHATE 900 MG/50ML IV SOLN
INTRAVENOUS | Status: AC
  Filled 2018-11-25: qty 50

## 2018-11-25 MED ORDER — ACETAMINOPHEN 500 MG PO TABS
1000.0000 mg | ORAL_TABLET | Freq: Once | ORAL | Status: AC
  Administered 2018-11-25: 1000 mg via ORAL
  Filled 2018-11-25: qty 2

## 2018-11-25 MED ORDER — SODIUM CHLORIDE 0.9 % IR SOLN
Status: DC | PRN
  Administered 2018-11-25: 9000 mL

## 2018-11-25 MED ORDER — TRAMADOL HCL 50 MG PO TABS: 50.0000 mg | ORAL_TABLET | Freq: Three times a day (TID) | ORAL | Status: DC | PRN

## 2018-11-25 MED ORDER — ONDANSETRON HCL 4 MG/2ML IJ SOLN
INTRAMUSCULAR | Status: DC | PRN
  Administered 2018-11-25: 4 mg via INTRAVENOUS

## 2018-11-25 MED ORDER — METOCLOPRAMIDE HCL 5 MG/ML IJ SOLN: 5.0000 mg | Freq: Three times a day (TID) | INTRAMUSCULAR | Status: DC | PRN

## 2018-11-25 MED ORDER — LIDOCAINE 2% (20 MG/ML) 5 ML SYRINGE
INTRAMUSCULAR | Status: AC
  Filled 2018-11-25: qty 5

## 2018-11-25 MED ORDER — GABAPENTIN 300 MG PO CAPS
600.0000 mg | ORAL_CAPSULE | Freq: Two times a day (BID) | ORAL | Status: DC
  Administered 2018-11-25 – 2018-11-27 (×6): 600 mg via ORAL
  Filled 2018-11-25 (×7): qty 2

## 2018-11-25 MED ORDER — EPHEDRINE SULFATE 50 MG/ML IJ SOLN
INTRAMUSCULAR | Status: DC | PRN
  Administered 2018-11-25: 5 mg via INTRAVENOUS

## 2018-11-25 MED ORDER — HYDROMORPHONE HCL 1 MG/ML IJ SOLN: 0.5000 mg | INTRAMUSCULAR | Status: DC | PRN

## 2018-11-25 MED ORDER — SALINE SPRAY 0.65 % NA SOLN
1.0000 | NASAL | Status: DC | PRN
  Filled 2018-11-25: qty 44

## 2018-11-25 MED ORDER — CLINDAMYCIN PHOSPHATE 900 MG/50ML IV SOLN
INTRAVENOUS | Status: DC | PRN
  Administered 2018-11-25: 900 mg via INTRAVENOUS

## 2018-11-25 MED ORDER — PROPOFOL 10 MG/ML IV BOLUS
INTRAVENOUS | Status: DC | PRN
  Administered 2018-11-25: 50 mg via INTRAVENOUS
  Administered 2018-11-25: 100 mg via INTRAVENOUS

## 2018-11-25 MED ORDER — ENOXAPARIN SODIUM 40 MG/0.4ML ~~LOC~~ SOLN
40.0000 mg | SUBCUTANEOUS | Status: DC
  Administered 2018-11-26 – 2018-11-27 (×2): 40 mg via SUBCUTANEOUS
  Filled 2018-11-25 (×2): qty 0.4

## 2018-11-25 MED ORDER — CALCIUM CARBONATE ANTACID 500 MG PO CHEW: 1.0000 | CHEWABLE_TABLET | Freq: Two times a day (BID) | ORAL | Status: DC | PRN

## 2018-11-25 MED ORDER — METHOCARBAMOL 500 MG PO TABS
ORAL_TABLET | ORAL | Status: AC
  Filled 2018-11-25: qty 1

## 2018-11-25 MED ORDER — METHOCARBAMOL 1000 MG/10ML IJ SOLN
500.0000 mg | Freq: Four times a day (QID) | INTRAVENOUS | Status: DC | PRN
  Filled 2018-11-25: qty 5

## 2018-11-25 MED ORDER — DOCUSATE SODIUM 100 MG PO CAPS
100.0000 mg | ORAL_CAPSULE | Freq: Two times a day (BID) | ORAL | Status: DC
  Administered 2018-11-25 – 2018-11-27 (×3): 100 mg via ORAL
  Filled 2018-11-25 (×6): qty 1

## 2018-11-25 MED ORDER — BUPIVACAINE-EPINEPHRINE 0.5% -1:200000 IJ SOLN
INTRAMUSCULAR | Status: DC | PRN
  Administered 2018-11-25: 10 mL

## 2018-11-25 MED ORDER — POLYETHYLENE GLYCOL 3350 17 G PO PACK: 17.0000 g | PACK | Freq: Every day | ORAL | Status: DC | PRN

## 2018-11-25 MED ORDER — ONDANSETRON HCL 4 MG/2ML IJ SOLN: 4.0000 mg | Freq: Four times a day (QID) | INTRAMUSCULAR | Status: DC | PRN

## 2018-11-25 MED ORDER — ALBUTEROL SULFATE (2.5 MG/3ML) 0.083% IN NEBU
2.5000 mg | INHALATION_SOLUTION | Freq: Four times a day (QID) | RESPIRATORY_TRACT | Status: DC | PRN
  Administered 2018-11-26: 2.5 mg via RESPIRATORY_TRACT
  Filled 2018-11-25: qty 3

## 2018-11-25 MED ORDER — METOCLOPRAMIDE HCL 5 MG PO TABS: 5.0000 mg | ORAL_TABLET | Freq: Three times a day (TID) | ORAL | Status: DC | PRN

## 2018-11-25 MED ORDER — KETOROLAC TROMETHAMINE 15 MG/ML IJ SOLN
INTRAMUSCULAR | Status: AC
  Filled 2018-11-25: qty 1

## 2018-11-25 MED ORDER — LISINOPRIL 20 MG PO TABS
20.0000 mg | ORAL_TABLET | Freq: Every morning | ORAL | Status: DC
  Administered 2018-11-25 – 2018-11-27 (×3): 20 mg via ORAL
  Filled 2018-11-25 (×3): qty 1

## 2018-11-25 MED ORDER — DEXAMETHASONE SODIUM PHOSPHATE 10 MG/ML IJ SOLN
INTRAMUSCULAR | Status: AC
  Filled 2018-11-25: qty 1

## 2018-11-25 MED ORDER — LIDOCAINE 2% (20 MG/ML) 5 ML SYRINGE
INTRAMUSCULAR | Status: DC | PRN
  Administered 2018-11-25: 60 mg via INTRAVENOUS

## 2018-11-25 MED ORDER — OXYCODONE HCL 5 MG PO TABS
5.0000 mg | ORAL_TABLET | Freq: Four times a day (QID) | ORAL | Status: DC | PRN
  Administered 2018-11-25 – 2018-11-28 (×10): 10 mg via ORAL
  Filled 2018-11-25: qty 1
  Filled 2018-11-25 (×9): qty 2

## 2018-11-25 MED ORDER — PHENYLEPHRINE HCL (PRESSORS) 10 MG/ML IV SOLN
INTRAVENOUS | Status: DC | PRN
  Administered 2018-11-25: 120 ug via INTRAVENOUS
  Administered 2018-11-25: 80 ug via INTRAVENOUS

## 2018-11-25 MED ORDER — LACTATED RINGERS IV SOLN
INTRAVENOUS | Status: DC
  Administered 2018-11-26 – 2018-11-27 (×5): via INTRAVENOUS

## 2018-11-25 MED ORDER — SODIUM CHLORIDE 0.9 % IV SOLN
2.0000 g | Freq: Three times a day (TID) | INTRAVENOUS | Status: DC
  Administered 2018-11-25 – 2018-11-28 (×9): 2 g via INTRAVENOUS
  Filled 2018-11-25 (×12): qty 2

## 2018-11-25 MED ORDER — CALCIUM CARBONATE ANTACID 500 MG PO CHEW: 4.0000 | CHEWABLE_TABLET | Freq: Every day | ORAL | Status: DC | PRN

## 2018-11-25 MED ORDER — GENTAMICIN SULFATE 40 MG/ML IJ SOLN
INTRAMUSCULAR | Status: AC
  Filled 2018-11-25: qty 2

## 2018-11-25 MED ORDER — HYDROMORPHONE HCL 1 MG/ML IJ SOLN: 0.2500 mg | INTRAMUSCULAR | Status: DC | PRN

## 2018-11-25 MED ORDER — HYDROCODONE-ACETAMINOPHEN 5-325 MG PO TABS: 1.0000 | ORAL_TABLET | Freq: Four times a day (QID) | ORAL | 0 refills | Status: DC | PRN

## 2018-11-25 SURGICAL SUPPLY — 42 items
BANDAGE ACE 4X5 VEL STRL LF (GAUZE/BANDAGES/DRESSINGS) ×2 IMPLANT
BANDAGE ACE 6X5 VEL STRL LF (GAUZE/BANDAGES/DRESSINGS) IMPLANT
BANDAGE ESMARK 6X9 LF (GAUZE/BANDAGES/DRESSINGS) IMPLANT
BLADE CLIPPER SURG (BLADE) IMPLANT
BLADE GREAT WHITE 4.2 (BLADE) ×2 IMPLANT
BLADE GREAT WHITE 4.2MM (BLADE) ×1
BNDG CMPR 9X6 STRL LF SNTH (GAUZE/BANDAGES/DRESSINGS)
BNDG ESMARK 6X9 LF (GAUZE/BANDAGES/DRESSINGS)
COVER SURGICAL LIGHT HANDLE (MISCELLANEOUS) ×3 IMPLANT
COVER WAND RF STERILE (DRAPES) ×3 IMPLANT
CUFF TOURNIQUET SINGLE 34IN LL (TOURNIQUET CUFF) IMPLANT
DRAPE ARTHROSCOPY W/POUCH 114 (DRAPES) ×3 IMPLANT
DRAPE PROXIMA HALF (DRAPES) ×3 IMPLANT
DRAPE U-SHAPE 47X51 STRL (DRAPES) ×3 IMPLANT
DRSG PAD ABDOMINAL 8X10 ST (GAUZE/BANDAGES/DRESSINGS) IMPLANT
DURAPREP 26ML APPLICATOR (WOUND CARE) ×3 IMPLANT
FACESHIELD WRAPAROUND (MASK) ×3 IMPLANT
FACESHIELD WRAPAROUND OR TEAM (MASK) ×1 IMPLANT
GAUZE SPONGE 4X4 12PLY STRL (GAUZE/BANDAGES/DRESSINGS) ×2 IMPLANT
GAUZE XEROFORM 1X8 LF (GAUZE/BANDAGES/DRESSINGS) ×3 IMPLANT
GLOVE BIO SURGEON STRL SZ7.5 (GLOVE) ×6 IMPLANT
GLOVE BIOGEL PI IND STRL 8 (GLOVE) ×2 IMPLANT
GLOVE BIOGEL PI INDICATOR 8 (GLOVE) ×4
GOWN STRL REUS W/ TWL LRG LVL3 (GOWN DISPOSABLE) ×2 IMPLANT
GOWN STRL REUS W/ TWL XL LVL3 (GOWN DISPOSABLE) ×1 IMPLANT
GOWN STRL REUS W/TWL LRG LVL3 (GOWN DISPOSABLE) ×6
GOWN STRL REUS W/TWL XL LVL3 (GOWN DISPOSABLE) ×3
KIT TURNOVER KIT B (KITS) ×3 IMPLANT
MANIFOLD NEPTUNE II (INSTRUMENTS) IMPLANT
NS IRRIG 1000ML POUR BTL (IV SOLUTION) IMPLANT
PACK ARTHROSCOPY DSU (CUSTOM PROCEDURE TRAY) ×3 IMPLANT
PAD ARMBOARD 7.5X6 YLW CONV (MISCELLANEOUS) ×6 IMPLANT
PADDING CAST COTTON 6X4 STRL (CAST SUPPLIES) IMPLANT
SET ARTHROSCOPY TUBING (MISCELLANEOUS) ×3
SET ARTHROSCOPY TUBING LN (MISCELLANEOUS) ×1 IMPLANT
SPONGE LAP 4X18 RFD (DISPOSABLE) ×3 IMPLANT
SUT ETHILON 2 0 FS 18 (SUTURE) IMPLANT
TOWEL OR 17X24 6PK STRL BLUE (TOWEL DISPOSABLE) ×3 IMPLANT
TOWEL OR 17X26 10 PK STRL BLUE (TOWEL DISPOSABLE) ×3 IMPLANT
TUBE CONNECTING 12'X1/4 (SUCTIONS) ×1
TUBE CONNECTING 12X1/4 (SUCTIONS) ×2 IMPLANT
WATER STERILE IRR 1000ML POUR (IV SOLUTION) ×3 IMPLANT

## 2018-11-25 NOTE — Progress Notes (Signed)
ID Progress Note:   Brian Morgan is a 73 y.o. male with known chronic osteomyelitis of the left native knee. Culture history noted for Pseudomonas with purulent outpatient joint arthrocentesis with WBC 95,000.   Intraoperative cell count noted for >230,000 WBCs and negative gram stain.   Will start him on Cefepime based on outpatient sensitivity 2gm Q8h with MIC 4 report in Dr. Debroah Loop H&P.    INT MIC   CEFEPIME S 4   CEFTAZIDIME S 4   CIPROFLOXACIN S <=0.25   GENTAMICIN S <=1   IMIPENEM S 1   LEVOFLOXACIN S 1   PIP/TAZOBACTAM I 16   TOBRAMYCIN S <=1   S=Susceptible I=Intermediate R=Resistant * = Not Tested   NR = Not Reported **NN = See Therapy Comments   Formal consult to follow in the AM.   Janene Madeira, MSN, NP-C [facility] [email or web address] Pager: [phone] Office: [phone] [place]: [phone]

## 2018-11-25 NOTE — Progress Notes (Signed)
PHARMACY NOTE:  ANTIMICROBIAL RENAL DOSAGE ADJUSTMENT  Current antimicrobial regimen includes a mismatch between antimicrobial dosage and estimated renal function.  As per policy approved by the Pharmacy & Therapeutics and Medical Executive Committees, the antimicrobial dosage will be adjusted accordingly.  Current antimicrobial dosage:  Cefepime 2 gm Q 12 hours  Indication: Osteomyelitis of left knee   Renal Function:  Estimated Creatinine Clearance: 67.6 mL/min (by C-G formula based on SCr of 0.92 mg/dL). []      On intermittent HD, scheduled: []      On CRRT    Antimicrobial dosage has been changed to:  Cefepime 2 gm Q 8 hours   Additional comments: Increased cefepime dosage to target  Pseudomonas isolated from office culture with Cefepime MIC 4      Thank you for allowing pharmacy to be a part of this patient's care.  Jimmy Footman, PharmD, BCPS, BCIDP Infectious Diseases Clinical Pharmacist Phone: [phone] 11/25/2018 4:34 PM

## 2018-11-25 NOTE — Evaluation (Signed)
Physical Therapy Evaluation Patient Details Name: Brian Morgan MRN: [id number] DOB: [birth year] Today's Date: 11/25/2018   History of Present Illness  73 yo admitted for left knee arthoscopy 4/21 due to chronic infection with recent aspiration yielding pseudomonas. Trauma to left knee in the 1980s without prior surgery.Marland Kitchen PMhx: HTN, hep C, asthma  Clinical Impression  Pt pleasant and providing home setup of 5 people living in 2 bedroom apartment. Pt sleeps in recliner so he won't have to sleep on inflated mattress. Pt with knee pain and issues since Nov 2018 with progress weakness and loss of function with reliance on Rw. PT tends to maintain LLE in knee flexion with external rotation with education for postioning, transfers and HEP. Pt in chair with foam under heel end of session to promote extension. Pt lacks grossly 5 degrees of active knee extension on left. Pt with decreased strength, ROM, transfers and gait who will benefit from acute therapy to maximize mobility, independence and strength. HEP from [facility] provided and discussed with pt as he progresses with strength and function   Access Code: [email or web address]  URL: [email or web address]  Date: 11/25/2018  Prepared by: Marquis Buggy   Exercises  Supine Ankle Pumps - 10 reps - 1 sets - 3x daily - 7x weekly  Supine Quad Set - 10 reps - 1 sets - 3x daily - 7x weekly  Supine Heel Slides - 10 reps - 1 sets - 3x daily - 7x weekly  Supine Active Straight Leg Raise - 10 reps - 1 sets - 3x daily - 7x weekly  Supine Hip Abduction - 10 reps - 1 sets - 3x daily - 7x weekly  Seated Long Arc Quad - 10 reps - 1 sets - 3x daily - 7x weekly    Follow Up Recommendations Outpatient PT(pt refusing HHPT and reports he can arrange medical transportation)    Equipment Recommendations  None recommended by PT    Recommendations for Other Services       Precautions / Restrictions Precautions Precautions: Fall Restrictions Weight Bearing  Restrictions: Yes LLE Weight Bearing: Weight bearing as tolerated      Mobility  Bed Mobility Overal bed mobility: Modified Independent             General bed mobility comments: with increased time and use of bil UE to move LLE  Transfers Overall transfer level: Needs assistance   Transfers: Sit to/from Stand Sit to Stand: Min guard         General transfer comment: cues for hand placement and safety  Ambulation/Gait Ambulation/Gait assistance: Min guard Gait Distance (Feet): 40 Feet Assistive device: Rolling walker (2 wheeled) Gait Pattern/deviations: Step-to pattern   Gait velocity interpretation: 1.31 - 2.62 ft/sec, indicative of limited community ambulator General Gait Details: pt with cues for sequence. Pt initially maintaining NWB on LLe with progression to PWB during gait. pt reports only tolerating limited weight on LLE for several weeks  Stairs            Wheelchair Mobility    Modified Rankin (Stroke Patients Only)       Balance Overall balance assessment: Needs assistance   Sitting balance-Leahy Scale: Good       Standing balance-Leahy Scale: Poor Standing balance comment: bil UE support in standing due to LLE pain and weakness                             Pertinent Vitals/Pain Pain Assessment: 0-10  Pain Score: 5  Pain Location: left knee Pain Descriptors / Indicators: Aching Pain Intervention(s): Limited activity within patient's tolerance;Premedicated before session;Monitored during session;Repositioned    Home Living Family/patient expects to be discharged to:: Private residence Living Arrangements: Spouse/significant other Available Help at Discharge: Family;Friend(s);Available 24 hours/day Type of Home: Apartment Home Access: Level entry     Home Layout: One level Home Equipment: Walker - 2 wheels      Prior Function Level of Independence: Independent with assistive device(s)         Comments: pt reports  he sponges bathes, dresses himself, uses a Rw at all times for limited distance     Hand Dominance        Extremity/Trunk Assessment   Upper Extremity Assessment Upper Extremity Assessment: Overall WFL for tasks assessed    Lower Extremity Assessment Lower Extremity Assessment: LLE deficits/detail LLE Deficits / Details: pt with 2/5 strength quad, hamstring, 3/5 hip flexion, 5/5 dorsiflexion    Cervical / Trunk Assessment Cervical / Trunk Assessment: Normal  Communication   Communication: No difficulties  Cognition Arousal/Alertness: Awake/alert Behavior During Therapy: WFL for tasks assessed/performed Overall Cognitive Status: Within Functional Limits for tasks assessed                                        General Comments General comments (skin integrity, edema, etc.): pt reports no falls in the last year    Exercises General Exercises - Lower Extremity Ankle Circles/Pumps: AROM;15 reps;Seated;Left Quad Sets: AROM;10 reps;Seated;Left Long Arc Quad: AAROM;10 reps;Seated;Left Straight Leg Raises: PROM;Left;5 reps   Assessment/Plan    PT Assessment Patient needs continued PT services  PT Problem List Decreased strength;Decreased balance;Decreased knowledge of precautions;Decreased knowledge of use of DME;Decreased range of motion;Decreased mobility;Decreased activity tolerance       PT Treatment Interventions DME instruction;Functional mobility training;Balance training;Patient/family education;Gait training;Therapeutic activities;Therapeutic exercise    PT Goals (Current goals can be found in the Care Plan section)  Acute Rehab PT Goals Patient Stated Goal: go fishing (hasn't fished in 2 years since knee issues began) PT Goal Formulation: With patient Time For Goal Achievement: 12/09/18 Potential to Achieve Goals: Good    Frequency Min 5X/week   Barriers to discharge Decreased caregiver support      Co-evaluation                AM-PAC PT "6 Clicks" Mobility  Outcome Measure Help needed turning from your back to your side while in a flat bed without using bedrails?: A Little Help needed moving from lying on your back to sitting on the side of a flat bed without using bedrails?: A Little Help needed moving to and from a bed to a chair (including a wheelchair)?: A Little Help needed standing up from a chair using your arms (e.g., wheelchair or bedside chair)?: A Little Help needed to walk in hospital room?: A Little Help needed climbing 3-5 steps with a railing? : A Lot 6 Click Score: 17    End of Session   Activity Tolerance: Patient tolerated treatment well Patient left: in chair;with call bell/phone within reach Nurse Communication: Mobility status PT Visit Diagnosis: Other abnormalities of gait and mobility (R26.89);Muscle weakness (generalized) (M62.81)    Time: 1240-1313 PT Time Calculation (min) (ACUTE ONLY): 33 min   Charges:   PT Evaluation $PT Eval Moderate Complexity: 1 Mod PT Treatments $Therapeutic Activity: 8-22 mins  Elwyn Reach, PT Acute Rehabilitation Services Pager: [phone] Office: [place] 11/25/2018, 1:22 PM

## 2018-11-25 NOTE — Interval H&P Note (Signed)
I participated in the care of this patient and agree with the above history, physical and evaluation. I performed a review of the history and a physical exam as detailed   Hadden Steig Daniel Deavin Forst MD  

## 2018-11-25 NOTE — Op Note (Signed)
11/25/2018  8:47 AM  PATIENT:  Brian Morgan    PRE-OPERATIVE DIAGNOSIS:  LEFT KNEE INFECTION  POST-OPERATIVE DIAGNOSIS:  Same  PROCEDURE:  ARTHROSCOPY LEFT KNEE INCISION AND DRAINAGE WITH LAVAGE AND ARTHROSCOPIC SYNOVECTOMY AND CHONDRAPLASTY  SURGEON:  Renette Butters, MD  ASSISTANT: Roxan Hockey, PA-C, he was present and scrubbed throughout the case, critical for completion in a timely fashion, and for retraction, instrumentation, and closure.   ANESTHESIA:   gen  PREOPERATIVE INDICATIONS:  Brian Morgan is a  73 y.o. male with a diagnosis of LEFT KNEE INFECTION who failed conservative measures and elected for surgical management.    The risks benefits and alternatives were discussed with the patient preoperatively including but not limited to the risks of infection, bleeding, nerve injury, cardiopulmonary complications, the need for revision surgery, among others, and the patient was willing to proceed.  OPERATIVE IMPLANTS: none  OPERATIVE FINDINGS: purulent fluid.   BLOOD LOSS: min  COMPLICATIONS: none  TOURNIQUET TIME: none  OPERATIVE PROCEDURE:  Patient was identified in the preoperative holding area and site was marked by me He was transported to the operating theater and placed on the table in supine position taking care to pad all bony prominences. After a preincinduction time out anesthesia was induced. The left lower extremity was prepped and draped in normal sterile fashion and a pre-incision timeout was performed. He received held abx for preoperative antibiotics.   I created a infrapatellar medial and lateral portal as well as a superior lateral portal and placed an outflow cannula there.  Fluid was collected and sent for culture he was then given clindamycin for IV antibiotics  Arthroscope was inserted and examined the joint he had reactive synovium which was debrided using a shaver  All chondral surfaces had broken down there was no articular  cartilage visible throughout the knee  I removed some loose bodies using the shaver  After 9 L of irrigation and extensive debridement and synovectomy all arthroscopic equipment was removed portals were closed injected gentamicin mixed with Marcaine into the knee.  POST OPERATIVE PLAN: Weightbearing as tolerated begin suppressive antibiotics mobilize and aspirin for DVT prophylaxis

## 2018-11-25 NOTE — Anesthesia Procedure Notes (Signed)
Procedure Name: Intubation Date/Time: 11/25/2018 7:40 AM Performed by: Lance Coon, CRNA Pre-anesthesia Checklist: Patient identified, Emergency Drugs available, Suction available, Timeout performed and Patient being monitored Patient Re-evaluated:Patient Re-evaluated prior to induction Oxygen Delivery Method: Circle system utilized Preoxygenation: Pre-oxygenation with 100% oxygen Induction Type: IV induction and Rapid sequence Laryngoscope Size: Miller and 3 Grade View: Grade I Tube type: Oral Tube size: 7.5 mm Number of attempts: 1 Airway Equipment and Method: Stylet Placement Confirmation: ETT inserted through vocal cords under direct vision,  positive ETCO2 and breath sounds checked- equal and bilateral Secured at: 22 cm Tube secured with: Tape Dental Injury: Teeth and Oropharynx as per pre-operative assessment

## 2018-11-25 NOTE — Transfer of Care (Signed)
Immediate Anesthesia Transfer of Care Note  Patient: Brian Morgan  Procedure(s) Performed: ARTHROSCOPY LEFT KNEE INCISION AND DRAINAGE WITH LAVAGE AND ARTHROSCOPIC SYNOVECTOMY AND CHONDRAPLASTY (Left Knee)  Patient Location: PACU  Anesthesia Type:General  Level of Consciousness: awake and patient cooperative  Airway & Oxygen Therapy: Patient Spontanous Breathing  Post-op Assessment: Report given to RN and Post -op Vital signs reviewed and stable  Post vital signs: Reviewed and stable  Last Vitals:  Vitals Value Taken Time  BP    Temp    Pulse 75 11/25/2018  8:46 AM  Resp 17 11/25/2018  8:46 AM  SpO2 97 % 11/25/2018  8:46 AM  Vitals shown include unvalidated device data.  Last Pain:  Vitals:   11/25/18 9847  TempSrc: Oral  PainSc: 10-Worst pain ever         Complications: No apparent anesthesia complications

## 2018-11-25 NOTE — Anesthesia Postprocedure Evaluation (Signed)
Anesthesia Post Note  Patient: Brian Morgan  Procedure(s) Performed: ARTHROSCOPY LEFT KNEE INCISION AND DRAINAGE WITH LAVAGE AND ARTHROSCOPIC SYNOVECTOMY AND CHONDRAPLASTY (Left Knee)     Patient location during evaluation: PACU Anesthesia Type: General Level of consciousness: awake and alert Pain management: pain level controlled Vital Signs Assessment: post-procedure vital signs reviewed and stable Respiratory status: spontaneous breathing, nonlabored ventilation and respiratory function stable Cardiovascular status: blood pressure returned to baseline and stable Postop Assessment: no apparent nausea or vomiting Anesthetic complications: no    Last Vitals:  Vitals:   11/25/18 0900 11/25/18 0915  BP: 131/85 122/86  Pulse: 86 78  Resp: 18 20  Temp:    SpO2: 99% 99%    Last Pain:  Vitals:   11/25/18 0915  TempSrc:   PainSc: 0-No pain                 Juana Haralson,W. EDMOND

## 2018-11-26 ENCOUNTER — Other Ambulatory Visit: Payer: Self-pay | Admitting: Family Medicine

## 2018-11-26 ENCOUNTER — Inpatient Hospital Stay (HOSPITAL_COMMUNITY): Payer: 59

## 2018-11-26 ENCOUNTER — Encounter (HOSPITAL_COMMUNITY): Payer: Self-pay | Admitting: Orthopedic Surgery

## 2018-11-26 DIAGNOSIS — R079 Chest pain, unspecified: Secondary | ICD-10-CM

## 2018-11-26 DIAGNOSIS — B965 Pseudomonas (aeruginosa) (mallei) (pseudomallei) as the cause of diseases classified elsewhere: Secondary | ICD-10-CM

## 2018-11-26 DIAGNOSIS — M00862 Arthritis due to other bacteria, left knee: Principal | ICD-10-CM

## 2018-11-26 DIAGNOSIS — G8929 Other chronic pain: Secondary | ICD-10-CM

## 2018-11-26 DIAGNOSIS — Z886 Allergy status to analgesic agent status: Secondary | ICD-10-CM

## 2018-11-26 DIAGNOSIS — Z91018 Allergy to other foods: Secondary | ICD-10-CM

## 2018-11-26 DIAGNOSIS — F17210 Nicotine dependence, cigarettes, uncomplicated: Secondary | ICD-10-CM

## 2018-11-26 DIAGNOSIS — R001 Bradycardia, unspecified: Secondary | ICD-10-CM | POA: Diagnosis present

## 2018-11-26 DIAGNOSIS — M549 Dorsalgia, unspecified: Principal | ICD-10-CM

## 2018-11-26 DIAGNOSIS — Z880 Allergy status to penicillin: Secondary | ICD-10-CM

## 2018-11-26 DIAGNOSIS — M5430 Sciatica, unspecified side: Secondary | ICD-10-CM

## 2018-11-26 LAB — HEMOGLOBIN AND HEMATOCRIT, BLOOD
HCT: 38.4 % — ABNORMAL LOW (ref 39.0–52.0)
Hemoglobin: 12.9 g/dL — ABNORMAL LOW (ref 13.0–17.0)

## 2018-11-26 LAB — TROPONIN I
Troponin I: <0.03 ng/mL
Troponin I: <0.03 ng/mL (ref <0.03)
Troponin I: <0.03 ng/mL

## 2018-11-26 LAB — BASIC METABOLIC PANEL
Anion gap: 11 (ref 5–15)
BUN: 19 mg/dL (ref 8–23)
CO2: 23 mmol/L (ref 22–32)
Calcium: 8.6 mg/dL — ABNORMAL LOW (ref 8.9–10.3)
Chloride: 103 mmol/L (ref 98–111)
Creatinine, Ser: 0.55 mg/dL — ABNORMAL LOW (ref 0.61–1.24)
GFR calc Af Amer: >60 mL/min (ref >60)
GFR calc non Af Amer: >60 mL/min (ref >60)
Glucose, Bld: 158 mg/dL — ABNORMAL HIGH (ref 70–99)
Potassium: 4.1 mmol/L (ref 3.5–5.1)
Sodium: 137 mmol/L (ref 135–145)

## 2018-11-26 LAB — ECHOCARDIOGRAM COMPLETE
Height: 72 in
Weight: 2288 oz

## 2018-11-26 LAB — LIPID PANEL
Cholesterol: 116 mg/dL (ref 0–200)
HDL: 48 mg/dL (ref >40)
LDL Cholesterol: 59 mg/dL (ref 0–99)
Total CHOL/HDL Ratio: 2.4 RATIO
Triglycerides: 47 mg/dL (ref <150)
VLDL: 9 mg/dL (ref 0–40)

## 2018-11-26 LAB — MAGNESIUM: Magnesium: 1.8 mg/dL (ref 1.7–2.4)

## 2018-11-26 MED ORDER — APIXABAN 2.5 MG PO TABS: 2.5000 mg | ORAL_TABLET | Freq: Two times a day (BID) | ORAL | 0 refills | Status: DC

## 2018-11-26 NOTE — Progress Notes (Signed)
Subjective: Patient reports pain as moderate.. Denies chest pain.  Feels a bit weak.  Denies fever.  Tolerating diet.  Urinating.  +BM.  Early mobilization with therapy.  Objective:   VITALS:   Vitals:   11/25/18 1150 11/25/18 1751 11/26/18 0311 11/26/18 0507  BP: 128/86 129/80 114/69 135/77  Pulse: 65 (!) 42 (!) 50 (!) 43  Resp: 16 18  15   Temp: 97.9 F (36.6 C)   98.6 F (37 C)  TempSrc: Oral     SpO2: 96% 99% 99% 100%  Weight:      Height:       CBC Latest Ref Rng & Units 11/25/2018 05/01/2018 04/21/2018  WBC 4.0 - 10.5 K/uL 8.8 10.3 11.9(H)  Hemoglobin 13.0 - 17.0 g/dL 15.2 12.9(L) 13.1  Hematocrit 39.0 - 52.0 % 46.2 39.7 40.3  Platelets 150 - 400 K/uL 318 328 293   BMP Latest Ref Rng & Units 11/25/2018 05/01/2018 04/21/2018  Glucose 70 - 99 mg/dL 107(H) 114(H) 112(H)  BUN 8 - 23 mg/dL 8 7(L) 7(L)  Creatinine 0.61 - 1.24 mg/dL 0.92 0.80 0.82  Sodium 135 - 145 mmol/L 134(L) 138 136  Potassium 3.5 - 5.1 mmol/L 3.5 3.4(L) 3.0(L)  Chloride 98 - 111 mmol/L 98 104 100  CO2 22 - 32 mmol/L 24 23 23   Calcium 8.9 - 10.3 mg/dL 8.8(L) 9.1 8.5(L)   Intake/Output      04/21 0701 - 04/22 0700 04/22 0701 - 04/23 0700   P.O. 700 225   I.V. (mL/kg) 2811.7 (43.3)    IV Piggyback 194.9    Total Intake(mL/kg) 3706.6 (57.1) 225 (3.5)   Urine (mL/kg/hr) 825 (0.5)    Stool 0    Blood 10    Total Output 835    Net +2871.6 +225        Urine Occurrence 2 x    Stool Occurrence 1 x       Physical Exam: General: NAD.  Upright in bed.  Calm, conversant Resp: No increased wob Cardio: Mildly bradycardic.  HR 57.  Regular rhythm ABD soft Neurologically intact MSK LLE: Neurovascularly intact Sensation intact distally Feet warm Dorsiflexion/Plantar flexion intact Incision: dressing C/D/I   Assessment: 1 Day Post-Op  S/P Procedure(s) (LRB): ARTHROSCOPY LEFT KNEE INCISION AND DRAINAGE WITH LAVAGE AND ARTHROSCOPIC SYNOVECTOMY AND CHONDRAPLASTY (Left) by Dr. Ernesta Amble. Percell Miller on  11/25/2018  Active Problems:   Knee pain, left   Chronic infection of left knee (HCC)   Chronic left knee infection status post I&D/lavage, synovectomy and chondroplasty -Antibiotic therapy initiated by infectious disease who will see the patient today -Definitive treatment likely will require significant bone resection, and cement interposition.  Dr. Percell Miller has discussed this with Dr. Lauris Poag in [place] who performs this surgery.  For now, suppression prior to staged definitive surgery needed.   Plan: Up with therapy Continue ABX therapy due to chronic infection per ID recommendations Incentive Spirometry Elevate and Apply ice  Weightbearing: WBAT LLE Insicional and dressing care: Reinforce PRN VTE prophylaxis: Lovenox 40mg  qd, SCDs, ambulation Pain control: Continue chronic tramadol, gabapentin.  Oxycodone for breakthrough pain.  Contact information:  Edmonia Lynch MD, Roxan Hockey PA-C  Dispo: Remain inpatient pending ID recommendations/plan and discussion with Dr. Percell Miller  Anticipated LOS equal to or greater than 2 midnights due to - Age 38 and older with one or more of the following:  - Obesity  - Expected need for hospital services (PT, OT, Nursing) required for safe  discharge  -  Anticipated need for postoperative skilled nursing care or inpatient rehab   Prudencio Burly III, PA-C 11/26/2018, 8:15 AM

## 2018-11-26 NOTE — Progress Notes (Signed)
  Echocardiogram 2D Echocardiogram has been performed.  Jermal Dismuke L Androw 11/26/2018, 4:30 PM

## 2018-11-26 NOTE — Consult Note (Addendum)
Family Medicine Teaching Service Consult Note Intern Pager: [phone]  Patient name: Brian Morgan Medical record number: [id number] Date of birth: [birth year] Age: 73 y.o. Gender: male  Primary Care Provider: Danna Hefty, DO Primary: Ortho Other Consults: ID  Code Status: Full  Pt Overview and Major Events to Date:  4/21 left knee I&D lavage and arthroscopic synovectomy and chondroplasty 4/22 FPTS consulted for chest pain and bradycardia  Assessment and Plan: [name] is a 73 y.o. male who presented to the hospital for left knee I&D lavage and arthroscopic synovectomy and chondroplasty for chronic left knee infection on 4/21 and noted to have some chest pain and bradycardia following procedure.  His chronic medical problems include HTN, Hepatitis C, Chronic Back Pain, and COPD/Asthma.  Chest Pain 2 hrs of stabbing, non-radiating, substernal chest pain last PM starting around 9-10pm while he was in bed following procedure.  Resolved spontaneously.  Rated 3-4/10. Patient has chronic SOB 2/2 COPD, but was not overly SOB at the time.  He believes some "back sweating" occurred at the time, but states that this happens off and on chronically.  Spontaneously resolved.  Currently without chest pain.  No pain reproducible to palpation on exam.  No calf tenderness bilaterally.  PE unlikely given pain has resolved, patient not tachycardic, tachypneic, or hypoxic, although he is at increased risk given surgery yesterday.  Initial trop negative, EKG without acute changes.  Patient did have bradycardia overnight, but appears to be improving at present.  Reassured that this has resolved.  GERD also on differential, although no epigastric tenderness on exam. History does not lend to CAD, but will trend troponins to rule out.  Heart Score 3. LDL 59.  No prior ischemic workup noted in chart.  Anxiety could also be cause given occurred following procedure.   - trend trops - cardiac monitoring -  AM EKG  - patient advised to tell nurse if chest pain occurs again - Echo  Bradycardia Patient bradycardic overnight to 39 following procedure.  HR on my exam 60s-70.  Most recent recorded HR 54 at 0703.  Patient notes that he is asymptomatic during bradycardia and has never been bradycardic before.  Could be 2/2 anesthesia and appears to be improving.  Trops cycling per above.  Patient otherwise with stable vitals.  EKG without heart block, arrhythmia, or acute changes, however did show sinus bradycardia to 57.  Would recommended continued cardiac monitoring at this time as is improving.  BMP, Mag WNL.  Will obtain Hgb to assess for anemia post surgery. - cardiac monitoring  - H&H now - AM CBC, BMP - AM EKG  HTN On Lisinopril at home.  BP WNL. - cont home lisinopril, per primary  COPD On albuterol prn at home. - per primary  Chronic Left Knee Infection, 1 day S/P left knee I&D lavage and arthroscopic synovectomy and chondroplasty Management per primary and ID. - IV cefepime 2 gm IV q8hrs per ID recs - pain control per primary   FEN/GI: Regular PPx: Lovenox  Disposition: per primary  Subjective:  [name] is a 73 y.o. male who presented on 4/21 for scheduled left knee I&D lavage and arthroscopic synovectomy and chondroplasty for chronic left knee infection.  Following surgery on POD 0, patient notes that around 9 or 10 PM, he began to have sharp, nonradiating, substernal chest pain while lying in bed.  He states that this pain lasted for a few hours and then spontaneously resolved.  He  did not tell anyone about the pain.  He said that he is chronically short of breath due to COPD, but did not have any increased shortness of breath at that time.  He states he thinks" my back got sweaty."  He states that this is a chronic problem that occurs frequently throughout the day and night.  He states that he has never had pain like this before, and is currently without pain.  He  denies any other complaints aside from his left knee pain.  He denies heart palpitations, orthopnea.  Patient also noted to be bradycardic following his procedure.  He states that he was not symptomatic durnig that time and did not know that he was bradycardic.  He states that he has never been told this before.  Otherwise, patient is feeling well.  He had some chronic SOB this AM that improved with albuterol.  He has a chronic cough with clear sputum production.  Primary Team orthopedics called for consultation for patient for chest pain and bradycardia.  Review of Systems  Constitutional: Positive for diaphoresis. Negative for fever.  HENT: Negative for congestion.   Eyes: Negative for blurred vision.  Respiratory: Positive for cough, sputum production and shortness of breath.   Cardiovascular: Positive for chest pain. Negative for palpitations and orthopnea.  Gastrointestinal: Negative for constipation, diarrhea, nausea and vomiting.  Musculoskeletal: Negative for back pain.  Neurological: Negative for tingling, focal weakness and headaches.     Objective: Temp:  [98.6 F (37 C)] 98.6 F (37 C) (04/22 0507) Pulse Rate:  [42-50] 43 (04/22 0507) Resp:  [15-18] 15 (04/22 0507) BP: (114-135)/(69-80) 135/77 (04/22 0507) SpO2:  [99 %-100 %] 100 % (04/22 0507)  Physical Exam: General: 73 y.o. male in NAD Cardio: RRR no m/r/g, no TTP chest wall Lungs: CTAB, no wheezing, no rhonchi, no crackles, no increased WOB, on RA Abdomen: Soft, non-tender to palpation, positive bowel sounds Skin: warm and dry Extremities: left leg with ace wrap from thigh to foot, no TTP right/left calf  Laboratory: Recent Labs  Lab 11/25/18 0637  WBC 8.8  HGB 15.2  HCT 46.2  PLT 318   Recent Labs  Lab 11/25/18 0637 11/26/18 1001  NA 134* 137  K 3.5 4.1  CL 98 103  CO2 24 23  BUN 8 19  CREATININE 0.92 0.55*  CALCIUM 8.8* 8.6*  PROT 7.4  --   BILITOT 1.1  --   ALKPHOS 46  --   ALT 27  --    AST 46*  --   GLUCOSE 107* 158*    Imaging/Diagnostic Tests: No results found.  Miquel Lamson, Bernita Raisin, DO 11/26/2018, 1:52 PM PGY-1, [facility] Intern pager: [phone], text pages welcome

## 2018-11-26 NOTE — Progress Notes (Signed)
Physical Therapy Treatment Patient Details Name: Brian Morgan MRN: [id number] DOB: 03/18/1946 Today's Date: 11/26/2018    History of Present Illness 73 yo admitted for left knee arthoscopy 4/21 due to chronic infection with recent aspiration yielding pseudomonas. Trauma to left knee in the 1980s without prior surgery.Marland Kitchen PMhx: HTN, hep C, asthma    PT Comments    Pt performed gait training with in room.  He continues to heavily guard L knee.  Pt required cues for heel strike and knee extension.  Plan for OP PT for follow up therapies.     Follow Up Recommendations  Outpatient PT(Refusing HHPT and reports he can provide transportation.  )     Equipment Recommendations  None recommended by PT    Recommendations for Other Services       Precautions / Restrictions Precautions Precautions: Fall Restrictions Weight Bearing Restrictions: Yes LLE Weight Bearing: Weight bearing as tolerated    Mobility  Bed Mobility Overal bed mobility: Modified Independent             General bed mobility comments: with increased time and use of bil UE to move LLE  Transfers Overall transfer level: Needs assistance   Transfers: Sit to/from Stand Sit to Stand: Min guard         General transfer comment: cues for hand placement and safety  Ambulation/Gait Ambulation/Gait assistance: Min guard Gait Distance (Feet): 60 Feet Assistive device: Rolling walker (2 wheeled) Gait Pattern/deviations: Step-to pattern;Decreased stance time - left;Decreased dorsiflexion - left     General Gait Details: Pt continues to require cues for sequencing, L heel strike and L weight shifting.     Stairs             Wheelchair Mobility    Modified Rankin (Stroke Patients Only)       Balance Overall balance assessment: Needs assistance   Sitting balance-Leahy Scale: Good       Standing balance-Leahy Scale: Poor                              Cognition  Arousal/Alertness: Awake/alert Behavior During Therapy: WFL for tasks assessed/performed Overall Cognitive Status: Within Functional Limits for tasks assessed                                        Exercises      General Comments        Pertinent Vitals/Pain Pain Assessment: 0-10 Pain Score: 5  Pain Location: left knee Pain Descriptors / Indicators: Aching Pain Intervention(s): Monitored during session;Repositioned    Home Living                      Prior Function            PT Goals (current goals can now be found in the care plan section) Acute Rehab PT Goals Patient Stated Goal: go fishing (hasn't fished in 2 years since knee issues began) Potential to Achieve Goals: Good Progress towards PT goals: Progressing toward goals    Frequency    Min 5X/week      PT Plan Current plan remains appropriate    Co-evaluation              AM-PAC PT "6 Clicks" Mobility   Outcome Measure  Help needed turning from your back to your side  while in a flat bed without using bedrails?: A Little Help needed moving from lying on your back to sitting on the side of a flat bed without using bedrails?: A Little Help needed moving to and from a bed to a chair (including a wheelchair)?: A Little Help needed standing up from a chair using your arms (e.g., wheelchair or bedside chair)?: A Little Help needed to walk in hospital room?: A Little Help needed climbing 3-5 steps with a railing? : A Lot 6 Click Score: 17    End of Session Equipment Utilized During Treatment: Gait belt Activity Tolerance: Patient tolerated treatment well Patient left: in chair;with call bell/phone within reach Nurse Communication: Mobility status PT Visit Diagnosis: Other abnormalities of gait and mobility (R26.89);Muscle weakness (generalized) (M62.81)     Time: 5400-8676 PT Time Calculation (min) (ACUTE ONLY): 14 min  Charges:  $Gait Training: 8-22 mins                      Governor Rooks, PTA Acute Rehabilitation Services Pager [phone] Office [phone]     Tanashia Ciesla Eli Hose 11/26/2018, 4:51 PM

## 2018-11-26 NOTE — Consult Note (Signed)
[facility] Medical Consultation  Brian Morgan TXL:[id number] DOB: 1945-12-14 DOA: 11/25/2018 PCP: Danna Hefty, DO   Requesting physician: Edmonia Lynch, MD Date of consultation: 11/26/2018 Reason for consultation: Bradycardia  Impression/Recommendations Active Problems:   Knee pain, left   Chronic infection of left knee ([place])   Bradycardia   Originally called regarding patient having bradycardia overnight following washout for infected knee.  Upon further review of patient's chart he is seen by the family medicine practice service.  Initial orders of EKG, BMP, mag, troponin, and lipid panel ordered.  Notified Dr. Percell Miller regarding patient's primary.  Family medicine teaching service consulted and will see if patient is a.m.

## 2018-11-26 NOTE — Plan of Care (Signed)
  Problem: Pain Managment: Goal: General experience of comfort will improve Outcome: Progressing   

## 2018-11-26 NOTE — Consult Note (Signed)
[facility] for Infectious Disease    Date of Admission:  11/25/2018     Total days of antibiotics 2  Day 2 cefepime       Reason for Consult: Septic left native knee    Referring Provider: Dr. Fredonia Highland Primary Care Provider: Danna Hefty, DO   Assessment: Mr. Kerwin is a 73 year old gentleman with a chronic smoldering left knee infection.  Pseudomonas grown from outpatient tap with gram-negative rods growing on intraoperative culture with identification pending.  It sounds like he was partially treated for what I presume was a chronic osteo/septic arthritis back in November 2018 but did not undergo formal debridement at that time.  For the last year and a half it sounds like he has struggled with ongoing pain and swelling of this joint and what sounds to be significant destruction to the bone/joint.  He underwent arthroscopic irrigation and debridement yesterday with plans for eventual total replacement with a surgeon in [place] area - the patient is quite concerned about how he will make it to these appointments.    He is not a great candidate for a home PICC line given his complex social situation and that I do not quite understand and he is not forthcoming about.  He bluntly refuses even considering the option of a facility for treatment and rehab.  I think the safest thing for him at this time would be to treat with a high dose fluoroquinolone should this come back revealing the same Pseudomonas observed with recent outpatient aspirate.  No drug interactions identified that would make this a ineffective option for him.  Fluoroquinolones are highly bioavailable and would treat a bone infection nicely.  Would continue cefepime until ready for discharge and transition him to Levaquin 750 mg daily for 6 weeks barring his intraoperative cultures are sensitive to this.    Plan: 1. Continue cefepime 2 g IV every 8 hours 2. Follow micro data and adjust therapy as  indicated with plan for oral therapy with Levaquin at discharge barring susceptibilities match.   Active Problems:   Knee pain, left   Chronic infection of left knee (HCC)   Bradycardia   . acetaminophen  1,000 mg Oral Q8H  . docusate sodium  100 mg Oral BID  . enoxaparin (LOVENOX) injection  40 mg Subcutaneous Q24H  . gabapentin  600 mg Oral BID  . lisinopril  20 mg Oral q morning - 10a     HPI: Brian Morgan is a 73 y.o. male admitted for surgery to clean out a chronically smoldering left knee infection.  He tells me that the last time he can remember a non-painful left knee was in January 2018.  He says that slowly over the ensuing months he progressively had increased swelling and pain and by July 2018 he was using a cane every day to ambulate.  By late October early November he was confined to a wheelchair with attempts to walk in the house with a walker.  He recalls having a lot of pain at this time and swelling; no particular fevers from what he can remember although he has had night sweats consistently for quite a while now.  He cannot tell me exactly when these started.  In November 2018 he unfortunately suffered a fall due to extreme pain he was admitted to [facility] in [place].  He says he had infection in his blood at this time and was given IV  antibiotics and from what I understand underwent serial taps of the left knee and received antibiotics in the outpatient setting through a PICC line for 3 weeks.  He did not have any formal surgery at this time and recalls the antibiotic being a 3 time a day medication but he does not know what the original organism was.  He says this is the last time he has taken any antibiotics for any reason.  Since this time he is moved to a couple of different locations in [place] (originally from [place] area).  He has been in [place] since July 2019 and was managing his pain through the pain management team.  He is no  longer with this team and his medical team has been treating with gabapentin and tramadol which, although he takes routinely, provides no relief for him.  He says that his knee looks like he had to softballs on either side prior to coming in for surgery.  He continues to report night sweats but denies any weight loss, fevers, chills.  He is allergic to penicillin and reports hives and itching.  He has done well with current antibiotic choice of cefepime and has no symptoms or concerns for adverse reactions.  He has a previous history of injection drug use with cocaine with last use in 2000.  He is down to 4 cigarettes a day but uses nicotine lozenge supplementation 3 times a day as needed.  He drinks alcohol most nights of the week to help with pain.  Described whiskey or bourbon that he sips on all evening.  He has transportation trouble and no consistent and reliable ride anywhere outside of local area.  He also tells me that home health is not an option for him as his current landlord will not allow anybody not paying rent in the home.  He does not provide any further details about this.  Review of Systems: Review of Systems  All other systems reviewed and are negative. 12-point review of systems completed   Past Medical History:  Diagnosis Date  . Arthritis    lumbar, HNP, L knee - end stage arthritis , [facility]    . Asthma    stress related - las t time- 8 months ago  . COPD (chronic obstructive pulmonary disease) ([place]) 2014  . Hepatitis    Hep. C- history  . Hepatitis C antibody test positive   . History of kidney stones   . Hypertension    Dr. Thomes Dinning, [place]   . PPD positive    treated in 1970, + PPD, pt. had been exposed   . Sciatica   . Substance abuse ([place]) quit 02/04/00   h/o marijuana,  IV cocaine    Social History   Tobacco Use  . Smoking status: Current Every Day Smoker    Packs/day: 0.25    Years: 5.00    Pack years: 1.25    Types:  Cigarettes  . Smokeless tobacco: Former Systems developer    Types: Snuff    Quit date: 09/03/2003  . Tobacco comment: Restarted cigarettes in 2015  Substance Use Topics  . Alcohol use: Yes    Comment: Daily  . Drug use: No    Comment: H/o marijuana and IV Cocaine.  Quit 02/04/00    Family History  Problem Relation Age of Onset  . Heart disease Mother   . Hypertension Mother   . Cancer Father        type unknown  .  Heart disease Father   . Asthma Father   . Hypertension Father   . Cancer Brother        brain tumor, x 2 brothers  . Cancer Maternal Grandmother        type unknown  . Asthma Sister   . Cirrhosis Maternal Grandfather   . Heart failure Maternal Grandfather   . Heart disease Paternal Grandfather    Allergies  Allergen Reactions  . Penicillins Hives, Itching and Rash    Did it involve swelling of the face/tongue/throat, SOB, or low BP? No Did it involve sudden or severe rash/hives, skin peeling, or any reaction on the inside of your mouth or nose? #  #  #  YES  #  #  #  Did you need to seek medical attention at a hospital or doctor's office? #  #  #  YES  #  #  #  When did it last happen?15-20 Years   . Aspirin Hives, Itching and Rash  . Tomato Hives  . Onion Nausea And Vomiting    OBJECTIVE: Blood pressure 135/77, pulse (!) 43, temperature 98.6 F (37 C), resp. rate 15, height 6' (1.829 m), weight 64.9 kg, SpO2 100 %.  Physical Exam Constitutional:      Appearance: Normal appearance. He is not ill-appearing.     Comments: Sitting up in bed.  Appears uncomfortable and in some pain.  HENT:     Mouth/Throat:     Mouth: Mucous membranes are moist.     Pharynx: Oropharynx is clear.  Eyes:     General: No scleral icterus.    Conjunctiva/sclera: Conjunctivae normal.     Pupils: Pupils are equal, round, and reactive to light.  Cardiovascular:     Rate and Rhythm: Normal rate and regular rhythm.     Heart sounds: No murmur.  Pulmonary:     Effort: Pulmonary  effort is normal. No respiratory distress.     Breath sounds: Normal breath sounds.  Abdominal:     General: There is no distension.     Palpations: Abdomen is soft.  Musculoskeletal:     Comments: His left knee is in a Ace wrap with ice overlying the joint.  It does look quite swollen compared to the right knee.  Dressing material is clean and dry.  Right knee is unremarkable  Skin:    General: Skin is warm and dry.     Capillary Refill: Capillary refill takes less than 2 seconds.     Findings: No rash.  Neurological:     Mental Status: He is alert and oriented to person, place, and time.  Psychiatric:        Mood and Affect: Mood normal.        Behavior: Behavior normal.     Lab Results Lab Results  Component Value Date   WBC 8.8 11/25/2018   HGB 15.2 11/25/2018   HCT 46.2 11/25/2018   MCV 98.9 11/25/2018   PLT 318 11/25/2018    Lab Results  Component Value Date   CREATININE 0.55 (L) 11/26/2018   BUN 19 11/26/2018   NA 137 11/26/2018   K 4.1 11/26/2018   CL 103 11/26/2018   CO2 23 11/26/2018    Lab Results  Component Value Date   ALT 27 11/25/2018   AST 46 (H) 11/25/2018   ALKPHOS 46 11/25/2018   BILITOT 1.1 11/25/2018     Microbiology: Recent Results (from the past 240 hour(s))  Surgical pcr screen  Status: None   Collection Time: 11/25/18  7:04 AM  Result Value Ref Range Status   MRSA, PCR NEGATIVE NEGATIVE Final   Staphylococcus aureus NEGATIVE NEGATIVE Final    Comment: (NOTE) The Xpert SA Assay (FDA approved for NASAL specimens in patients 26 years of age and older), is one component of a comprehensive surveillance program. It is not intended to diagnose infection nor to guide or monitor treatment. Performed at [facility], [place] [address], [place], [place] [address]   Aerobic/Anaerobic Culture (surgical/deep wound)     Status: None (Preliminary result)   Collection Time: 11/25/18  8:09 AM  Result Value Ref Range Status   Specimen  Description KNEE  Final   Special Requests LEFT FLUID  Final   Gram Stain   Final    ABUNDANT WBC PRESENT, PREDOMINANTLY PMN NO ORGANISMS SEEN Performed at [facility], [place] [address], [place],  [address]    Culture RARE Lonell Grandchild NEGATIVE RODS  Final   Report Status PENDING  Incomplete    Janene Madeira, MSN, NP-C [facility] Pager: [phone]  11/26/2018 12:28 PM

## 2018-11-27 DIAGNOSIS — M8668 Other chronic osteomyelitis, other site: Secondary | ICD-10-CM

## 2018-11-27 DIAGNOSIS — R001 Bradycardia, unspecified: Secondary | ICD-10-CM

## 2018-11-27 LAB — BASIC METABOLIC PANEL
Anion gap: 6 (ref 5–15)
BUN: 13 mg/dL (ref 8–23)
CO2: 25 mmol/L (ref 22–32)
Calcium: 8 mg/dL — ABNORMAL LOW (ref 8.9–10.3)
Chloride: 110 mmol/L (ref 98–111)
Creatinine, Ser: 0.75 mg/dL (ref 0.61–1.24)
GFR calc Af Amer: >60 mL/min (ref >60)
GFR calc non Af Amer: >60 mL/min (ref >60)
Glucose, Bld: 108 mg/dL — ABNORMAL HIGH (ref 70–99)
Potassium: 4.8 mmol/L (ref 3.5–5.1)
Sodium: 141 mmol/L (ref 135–145)

## 2018-11-27 LAB — CBC
HCT: 36.4 % — ABNORMAL LOW (ref 39.0–52.0)
Hemoglobin: 11.8 g/dL — ABNORMAL LOW (ref 13.0–17.0)
MCH: 32.2 pg (ref 26.0–34.0)
MCHC: 32.4 g/dL (ref 30.0–36.0)
MCV: 99.5 fL (ref 80.0–100.0)
Platelets: 249 10*3/uL (ref 150–400)
RBC: 3.66 MIL/uL — ABNORMAL LOW (ref 4.22–5.81)
RDW: 13.2 % (ref 11.5–15.5)
WBC: 7.1 10*3/uL (ref 4.0–10.5)
nRBC: 0 % (ref 0.0–0.2)

## 2018-11-27 MED ORDER — CALCIUM CARBONATE ANTACID 500 MG PO CHEW
1.0000 | CHEWABLE_TABLET | Freq: Two times a day (BID) | ORAL | Status: AC
  Administered 2018-11-27 (×2): 200 mg via ORAL
  Filled 2018-11-27 (×2): qty 1

## 2018-11-27 MED ORDER — APIXABAN 2.5 MG PO TABS: 2.5000 mg | ORAL_TABLET | Freq: Two times a day (BID) | ORAL | 0 refills | Status: DC

## 2018-11-27 MED ORDER — HYDROMORPHONE HCL 1 MG/ML IJ SOLN: 0.5000 mg | INTRAMUSCULAR | Status: DC | PRN

## 2018-11-27 MED ORDER — LEVOFLOXACIN 750 MG PO TABS: 750.0000 mg | ORAL_TABLET | Freq: Every day | ORAL | 0 refills | Status: DC

## 2018-11-27 MED FILL — levoFLOXacin 750 MG TABS: 750 | 42 days supply | Qty: 42 | Fill #0

## 2018-11-27 MED FILL — ELIQUIS 2.5 MG TABLET: 2.5 | 14 days supply | Qty: 28 | Fill #0

## 2018-11-27 NOTE — Progress Notes (Signed)
Physical Therapy Treatment Patient Details Name: Brian Morgan MRN: [id number] DOB: Dec 08, 1945 Today's Date: 11/27/2018    History of Present Illness 73 yo admitted for left knee arthoscopy 4/21 due to chronic infection with recent aspiration yielding pseudomonas. Trauma to left knee in the 1980s without prior surgery.Marland Kitchen PMhx: HTN, hep C, asthma    PT Comments    Pt performed supine LE exercises to improve ROM and strength in LLE.  Pt  Refused OOB mobility during session due to pain from ambulating to and from the bathroom.  Informed nursing of need for L PRAFO boot to improve knee extension and dorsiflexion on LLE.  He will f/u with OPPT.    Follow Up Recommendations  Outpatient PT(Pt is refusing HHPT and reports he can provide transportation to and from appointments.  )     Equipment Recommendations  None recommended by PT    Recommendations for Other Services       Precautions / Restrictions Precautions Precautions: Fall Restrictions Weight Bearing Restrictions: Yes LLE Weight Bearing: Weight bearing as tolerated    Mobility  Bed Mobility               General bed mobility comments: Pt refused OOB mobility reports feeling fatigued and painful from trip to the bathroom.    Transfers                    Ambulation/Gait                 Stairs             Wheelchair Mobility    Modified Rankin (Stroke Patients Only)       Balance                                            Cognition Arousal/Alertness: Awake/alert Behavior During Therapy: WFL for tasks assessed/performed Overall Cognitive Status: Within Functional Limits for tasks assessed                                        Exercises General Exercises - Lower Extremity Ankle Circles/Pumps: AROM;15 reps;Seated;Left Quad Sets: AROM;10 reps;Seated;Left Heel Slides: AAROM;10 reps;Supine;Left Straight Leg Raises: AAROM;10 reps;Supine;Left     General Comments        Pertinent Vitals/Pain Pain Assessment: 0-10 Pain Score: 6  Pain Location: left knee Pain Descriptors / Indicators: Aching Pain Intervention(s): Monitored during session;Repositioned    Home Living                      Prior Function            PT Goals (current goals can now be found in the care plan section) Acute Rehab PT Goals Patient Stated Goal: go fishing (hasn't fished in 2 years since knee issues began) Potential to Achieve Goals: Good Progress towards PT goals: Progressing toward goals    Frequency    Min 5X/week      PT Plan Current plan remains appropriate    Co-evaluation              AM-PAC PT "6 Clicks" Mobility   Outcome Measure  Help needed turning from your back to your side while in a flat bed without using bedrails?: A Little Help  needed moving from lying on your back to sitting on the side of a flat bed without using bedrails?: A Little Help needed moving to and from a bed to a chair (including a wheelchair)?: A Little Help needed standing up from a chair using your arms (e.g., wheelchair or bedside chair)?: A Little Help needed to walk in hospital room?: A Little Help needed climbing 3-5 steps with a railing? : A Little 6 Click Score: 18    End of Session   Activity Tolerance: Patient tolerated treatment well;Patient limited by fatigue;Patient limited by pain Patient left: in bed;with call bell/phone within reach Nurse Communication: Mobility status;Patient requests pain meds PT Visit Diagnosis: Other abnormalities of gait and mobility (R26.89);Muscle weakness (generalized) (M62.81)     Time: 7425-9563 PT Time Calculation (min) (ACUTE ONLY): 23 min  Charges:  $Therapeutic Exercise: 23-37 mins                     Governor Rooks, PTA Acute Rehabilitation Services Pager [phone] Office [phone]     Mariluz Crespo Eli Hose 11/27/2018, 10:21 AM

## 2018-11-27 NOTE — Consult Note (Signed)
Family Medicine Teaching Service Consult Note Intern Pager: [phone]  Patient name: Brian Morgan Medical record number: [id number] Date of birth: 1946/04/14 Age: 73 y.o. Gender: male  Primary Care Provider: Danna Hefty, DO Primary: Ortho Other Consults: ID  Code Status: Full  Pt Overview and Major Events to Date:  4/21 left knee I&D lavage and arthroscopic synovectomy and chondroplasty 4/22 FPTS consulted for chest pain and bradycardia  Assessment and Plan: [name] is a 73 y.o. male who presented to the hospital for left knee I&D lavage and arthroscopic synovectomy and chondroplasty for chronic left knee infection on 4/21 and noted to have some chest pain and bradycardia following procedure.  His chronic medical problems include HTN, Hepatitis C, Chronic Back Pain, and COPD/Asthma.  Chest Pain Lasted about 2 hrs on 4/21.  Has not recurred.  Trops negative x3.  EKG 4/22 non-ischemic, repeat this AM pending.  Echo 4/22 showed EF 60-65% with mild concentric LVH.  Lipid panel does not warrant statin. - pending EKG this AM, patient does not require further inpatient follow up of chest pain - patient advised to tell nurse if chest pain occurs again  Bradycardia Lowest documented HR overnight 45.  Patient remains asymptomatic with bradycardia.  HR this AM on exam 60.  Echo unremarkable for structural abnormality.  Ca is 8.0, although would expect CHF symptoms or QTc prolongation as cardiac manifestation, therefore doubt as cause, but will replete orally.  EKG this AM with improving QTc 465>449, no ischemic changes. No further inpatient work-up is warranted. - replete calcium with calcium carbonate x2 today - cardiac monitoring  - TSH, if WNL, will sign off  HTN On Lisinopril at home.  BP WNL. - cont home lisinopril, per primary  COPD On albuterol prn at home. - per primary  Chronic Left Knee Infection, 1 day S/P left knee I&D lavage and arthroscopic synovectomy and  chondroplasty Management per primary and ID. - IV cefepime 2 gm IV q8hrs per ID recs - pain control per primary   FEN/GI: Regular PPx: Lovenox  Disposition: per primary, FPTS will sign off pending TSH WNL  Subjective:  Patient denies any further episodes of chest pain.  Denies symptoms from bradycardia.  Only notes left knee pain.  Objective: Temp:  [97.4 F (36.3 C)-97.6 F (36.4 C)] 97.6 F (36.4 C) (04/23 0403) Pulse Rate:  [45-55] 45 (04/23 0403) Resp:  [16-17] 17 (04/23 0403) BP: (122-142)/(70-79) 142/78 (04/23 0403) SpO2:  [98 %-100 %] 99 % (04/23 0403)  Physical Exam: General: 73 y.o. male in NAD Cardio: RRR no m/r/g Lungs: CTAB, no wheezing, no rhonchi, no crackles Skin: warm and dry Extremities: bandage on left knee, ambulating with walker at beginning of exam   Laboratory: Recent Labs  Lab 11/25/18 0637 11/26/18 1520 11/27/18 0506  WBC 8.8  --  7.1  HGB 15.2 12.9* 11.8*  HCT 46.2 38.4* 36.4*  PLT 318  --  249   Recent Labs  Lab 11/25/18 0637 11/26/18 1001 11/27/18 0506  NA 134* 137 141  K 3.5 4.1 4.8  CL 98 103 110  CO2 24 23 25   BUN 8 19 13   CREATININE 0.92 0.55* 0.75  CALCIUM 8.8* 8.6* 8.0*  PROT 7.4  --   --   BILITOT 1.1  --   --   ALKPHOS 46  --   --   ALT 27  --   --   AST 46*  --   --   GLUCOSE 107*  158* 108*    Imaging/Diagnostic Tests: No results found.  [place], DO 11/27/2018, 8:24 AM PGY-1, [facility] Intern pager: [phone], text pages welcome

## 2018-11-27 NOTE — Plan of Care (Signed)
  Problem: Safety: Goal: Ability to remain free from injury will improve Outcome: Progressing   

## 2018-11-27 NOTE — Progress Notes (Addendum)
[facility] for Infectious Disease  Date of Admission:  11/25/2018      Total days of antibiotics 2  Day 2 cefepime           Patient ID: Brian Morgan is a 73 y.o. male with  Principal Problem:   Chronic infection of left knee ([place]) Active Problems:   Knee pain, left   Bradycardia   . calcium carbonate  1 tablet Oral BID  . docusate sodium  100 mg Oral BID  . enoxaparin (LOVENOX) injection  40 mg Subcutaneous Q24H  . gabapentin  600 mg Oral BID  . lisinopril  20 mg Oral q morning - 10a    SUBJECTIVE: Knee is "throbbing like a tooth ache" now after just working with physical therapy team. He had a fine night but some pain with external rotation of the foot. He is awaiting a boot to help correct that while in the bed. He has had no side effects to current antibiotics.   He spoke with his landlord and he will not be allowed to have any home health team enter the home so PICC line and IV antibiotics will not be suitable for him.   No further episodes of chest pain - thinks it "was just some gas."   Review of Systems: Review of Systems  Constitutional: Negative for chills and fever.  Respiratory: Negative for cough and shortness of breath.   Cardiovascular: Negative for chest pain.  Gastrointestinal: Negative for abdominal pain and diarrhea.  Genitourinary: Negative for dysuria.  Musculoskeletal: Positive for joint pain (L knee).  Skin: Negative for itching and rash.  Neurological: Negative for dizziness, focal weakness, weakness and headaches.    Allergies  Allergen Reactions  . Penicillins Hives, Itching and Rash    Did it involve swelling of the face/tongue/throat, SOB, or low BP? No Did it involve sudden or severe rash/hives, skin peeling, or any reaction on the inside of your mouth or nose? #  #  #  YES  #  #  #  Did you need to seek medical attention at a hospital or doctor's office? #  #  #  YES  #  #  #  When did it last happen?15-20  Years   . Aspirin Hives, Itching and Rash  . Tomato Hives  . Onion Nausea And Vomiting    OBJECTIVE: Vitals:   11/26/18 0507 11/26/18 1507 11/26/18 1958 11/27/18 0403  BP: 135/77 122/70 123/79 (!) 142/78  Pulse: (!) 43 (!) 55 (!) 51 (!) 45  Resp: '15 16 16 17  ' Temp: 98.6 F (37 C)  (!) 97.4 F (36.3 C) 97.6 F (36.4 C)  TempSrc:   Oral Oral  SpO2: 100% 98% 100% 99%  Weight:      Height:       Body mass index is 19.39 kg/m.  Physical Exam Vitals signs and nursing note reviewed.  Constitutional:      Comments: Awake and alert watching TV resting comfortably in bed.   Cardiovascular:     Rate and Rhythm: Regular rhythm. Bradycardia present.     Pulses: Normal pulses.     Heart sounds: No murmur.  Pulmonary:     Effort: Pulmonary effort is normal. No respiratory distress.     Breath sounds: Normal breath sounds.  Abdominal:     General: There is no distension.     Palpations: Abdomen is soft.  Musculoskeletal:     Comments:  L knee with ACE wrap and ice bag in place.   Skin:    General: Skin is warm and dry.     Lab Results Lab Results  Component Value Date   WBC 7.1 11/27/2018   HGB 11.8 (L) 11/27/2018   HCT 36.4 (L) 11/27/2018   MCV 99.5 11/27/2018   PLT 249 11/27/2018    Lab Results  Component Value Date   CREATININE 0.75 11/27/2018   BUN 13 11/27/2018   NA 141 11/27/2018   K 4.8 11/27/2018   CL 110 11/27/2018   CO2 25 11/27/2018    Lab Results  Component Value Date   ALT 27 11/25/2018   AST 46 (H) 11/25/2018   ALKPHOS 46 11/25/2018   BILITOT 1.1 11/25/2018     Microbiology: Recent Results (from the past 240 hour(s))  Surgical pcr screen     Status: None   Collection Time: 11/25/18  7:04 AM  Result Value Ref Range Status   MRSA, PCR NEGATIVE NEGATIVE Final   Staphylococcus aureus NEGATIVE NEGATIVE Final    Comment: (NOTE) The Xpert SA Assay (FDA approved for NASAL specimens in patients 23 years of age and older), is one component of a  comprehensive surveillance program. It is not intended to diagnose infection nor to guide or monitor treatment. Performed at [facility], [place] [address], [place], [place] [address]   Aerobic/Anaerobic Culture (surgical/deep wound)     Status: None (Preliminary result)   Collection Time: 11/25/18  8:09 AM  Result Value Ref Range Status   Specimen Description KNEE  Final   Special Requests LEFT FLUID  Final   Gram Stain   Final    ABUNDANT WBC PRESENT, PREDOMINANTLY PMN NO ORGANISMS SEEN Performed at [facility], [place] [address] [place], [place] [address]    Culture   Final    RARE GRAM NEGATIVE RODS NO ANAEROBES ISOLATED; CULTURE IN PROGRESS FOR 5 DAYS    Report Status PENDING  Incomplete   No results found for: ESRSEDRATE, CRP   ASSESSMENT & PLAN:  1. Chronic L Native Knee Osteomyelitis/Septic Arthritis = previously cultures from outpatient aspiration revealed pseudomonas - currently growing GNRs from intraoperative cultures with ID pending. Will continue cefepime for now IV while inpatient and plan to D/C home on Levaquin 750 mg QD for projected 6 weeks of treatment.   2. Discharge Planning = unable to place PICC line as his landlord will not allow home health to come in and he is not willing to consider SNF for treatment. I explained to him that this is the only option available that is PO and prior to D/C home we need to make certain that it is sensitive. Explained that he will be here at least until tomorrow.   3. Medication Monitoring = no QTc prolonging medications from home med list. QTc on EKG this AM within normal range @ 440 ms. After discharge will have him return to clinic in 10 days to check labs for him. Will check ESR/CRP in AM for baseline and follow outpatient with treatment.    ADDENDUM: 3:02 PM  Patient with FQ sensitive pseudomonas identified on cultures. Will discharge on levaquin 750 mg QD. Our pharmacy team counseled him on appropriate use and  side effects that would be concerning to report. I will have him come to ID clinic in 10 days to check in on him with medication and repeat labs for him. He will have 4 weeks of levaquin brought to  his bedside today.   Appt @ [facility] with me on Monday May 4th @ 2:30 pm   Janene Madeira, MSN, NP-C [facility] for Infectious Disease [facility]  > 30 minutes of time spent on d/c planning alone today for this patient. Pager: [phone]  11/27/2018  10:31 AM

## 2018-11-27 NOTE — Progress Notes (Signed)
Subjective: Patient reports pain as moderate and improving. Denies chest pain.  Denies fever.  Tolerating diet.  Urinating.  Improving mobilization with therapy.  Objective:   VITALS:   Vitals:   11/26/18 0507 11/26/18 1507 11/26/18 1958 11/27/18 0403  BP: 135/77 122/70 123/79 (!) 142/78  Pulse: (!) 43 (!) 55 (!) 51 (!) 45  Resp: 15 16 16 17   Temp: 98.6 F (37 C)  (!) 97.4 F (36.3 C) 97.6 F (36.4 C)  TempSrc:   Oral Oral  SpO2: 100% 98% 100% 99%  Weight:      Height:       CBC Latest Ref Rng & Units 11/27/2018 11/26/2018 11/25/2018  WBC 4.0 - 10.5 K/uL 7.1 - 8.8  Hemoglobin 13.0 - 17.0 g/dL 11.8(L) 12.9(L) 15.2  Hematocrit 39.0 - 52.0 % 36.4(L) 38.4(L) 46.2  Platelets 150 - 400 K/uL 249 - 318   BMP Latest Ref Rng & Units 11/27/2018 11/26/2018 11/25/2018  Glucose 70 - 99 mg/dL 108(H) 158(H) 107(H)  BUN 8 - 23 mg/dL 13 19 8   Creatinine 0.61 - 1.24 mg/dL 0.75 0.55(L) 0.92  Sodium 135 - 145 mmol/L 141 137 134(L)  Potassium 3.5 - 5.1 mmol/L 4.8 4.1 3.5  Chloride 98 - 111 mmol/L 110 103 98  CO2 22 - 32 mmol/L 25 23 24   Calcium 8.9 - 10.3 mg/dL 8.0(L) 8.6(L) 8.8(L)   Intake/Output      04/22 0701 - 04/23 0700 04/23 0701 - 04/24 0700   P.O. 825 240   I.V. (mL/kg) 2356 (36.3)    IV Piggyback 341.1    Total Intake(mL/kg) 3522.1 (54.3) 240 (3.7)   Urine (mL/kg/hr) 1825 (1.2) 450 (6.6)   Stool     Blood     Total Output 1825 450   Net +1697.1 -210           Physical Exam: General: NAD.  Upright in bed.  Calm, conversant Resp: No increased wob Cardio: Mildly bradycardic.  HR 58.  Regular rhythm ABD soft Neurologically intact MSK LLE: Neurovascularly intact Sensation intact distally Feet warm Dorsiflexion/Plantar flexion intact Incision: dressing C/D/I   Assessment: 2 Days Post-Op  S/P Procedure(s) (LRB): ARTHROSCOPY LEFT KNEE INCISION AND DRAINAGE WITH LAVAGE AND ARTHROSCOPIC SYNOVECTOMY AND CHONDRAPLASTY (Left) by Dr. Ernesta Amble. Percell Miller on  11/25/2018  Principal Problem:   Chronic infection of left knee [facility]) Active Problems:   Knee pain, left   Bradycardia   Chronic left knee infection status post I&D/lavage, synovectomy and chondroplasty -Antibiotic therapy initiated by infectious disease - cefepime 2 g IV every 8 hours.  Planning for 6 weeks of Levaquin 750 mg daily after discharge. -Definitive treatment likely will require significant bone resection, and cement interposition.  Dr. Percell Miller has discussed this with Dr. Lauris Poag in [place] who performs this surgery.  For now, suppression prior to staged definitive surgery needed.  Bradycardia: -Followed by family medicine service  Labs, EKG, echo ordered.  If these continue to be unremarkable, additional work-up not deemed necessary.  HTN, COPD Continue home medicines   Plan: Up with therapy Continue ABX therapy due to chronic infection per ID recommendations Incentive Spirometry Elevate and Apply ice   Weightbearing: WBAT LLE Insicional and dressing care: Reinforce PRN VTE prophylaxis: Lovenox 40mg  qd, SCDs, ambulation.  Plan for 2 weeks of Eliquis after discharge.  Details risks and benefits of same discussed with patient.  He verbalized understanding and agrees with this plan.  He has an allergy to aspirin (hives).  Pain control:  Continue chronic tramadol, gabapentin.  Oxycodone - for breakthrough pain.  Contact information:  Edmonia Lynch MD, Roxan Hockey PA-C  Dispo: Possible discharge today pending final recommendations by ID and assuming no additional cardiac work-up needed by family medicine team.    Anticipated LOS equal to or greater than 2 midnights due to - Age 60 and older with one or more of the following:  - Obesity  - Expected need for hospital services (PT, OT, Nursing) required for safe  discharge  - Anticipated need for postoperative skilled nursing care or inpatient rehab   Prudencio Burly III, PA-C 11/27/2018, 8:03 AM

## 2018-11-27 NOTE — TOC Transition Note (Addendum)
Transition of Care [facility]) - CM/SW Discharge Note   Patient Details  Name: Brian Morgan MRN: [id number] Date of Birth: [birth year]  Transition of Care [facility]) CM/SW Contact:  Zenon Mayo, RN Phone Number: 11/27/2018, 8:59 AM   Clinical Narrative:    Patient from home with wife, for discharge today, he is refusing [facility] services would like to have outpatient HHPT,  He will be on po abx's.  NCM scheduled a follow up for outpatient PT with outpatient rehab on [facility].  They should contact patient to make appointment.  Patient states he has no other concerns or issues.    4/24 Tomi Bamberger RN, BSN- patient will be on eliquis, benefit check in process, patient states he gets he medications at no cost thru [facility],  He also states [facility] will provide transportation for him today to go home.  His eliquis has been filled by the [facility] pharmacy.     Final next level of care: Home/Self Care Barriers to Discharge: No Barriers Identified   Patient Goals and CMS Choice Patient states their goals for this hospitalization and ongoing recovery are:: go home,get better   Choice offered to / list presented to : NA  Discharge Placement                       Discharge Plan and Services In-house Referral: NA Discharge Planning Services: CM Consult Post Acute Care Choice: NA          DME Arranged: N/A DME Agency: NA       HH Arranged: NA, Patient Refused HH          Social Determinants of Health (SDOH) Interventions     Readmission Risk Interventions Readmission Risk Prevention Plan 11/27/2018  Post Dischage Appt Complete  Medication Screening Complete  Transportation Screening Complete  Some recent data might be hidden

## 2018-11-27 NOTE — Discharge Summary (Signed)
Discharge Summary  Patient ID: Brian Morgan MRN: [id number] DOB/AGE: [birth year] 73 y.o.  Admit date: 11/25/2018 Discharge date: 11/28/2018  Admission Diagnoses:  Chronic infection of left knee [facility])  Discharge Diagnoses:  Principal Problem:   Chronic infection of left knee [facility]) Active Problems:   Knee pain, left   Bradycardia   Past Medical History:  Diagnosis Date  . Arthritis    lumbar, HNP, L knee - end stage arthritis , [facility]    . Asthma    stress related - las t time- 8 months ago  . COPD (chronic obstructive pulmonary disease) ([place]) 2014  . Hepatitis    Hep. C- history  . Hepatitis C antibody test positive   . History of kidney stones   . Hypertension    Dr. Thomes Dinning, [place]   . PPD positive    treated in 1970, + PPD, pt. had been exposed   . Sciatica   . Substance abuse ([place]) quit 02/04/00   h/o marijuana,  IV cocaine    Surgeries: Procedure(s): ARTHROSCOPY LEFT KNEE INCISION AND DRAINAGE WITH LAVAGE AND ARTHROSCOPIC SYNOVECTOMY AND CHONDRAPLASTY on 11/25/2018   Consultants (if any): Treatment Team:  Leeanne Rio, MD  Discharged Condition: Improved  Hospital Course: Brian Morgan is an 73 y.o. male who was admitted 11/25/2018 with a diagnosis of Chronic infection of left knee ([place]) and went to the operating room on 11/25/2018 and underwent the above named procedures.  He tolerated procedure well and slowly increased his mobilization with therapy.  He remained in the hospital for evaluation by infectious disease and determine plan for antibiotic therapy in the hospital and for infection suppression after discharge.  He was also evaluated by family medicine bradycardia and a transient episode of chest pain postoperatively.  This resolved spontaneously.  Labs, EKG, and echocardiogram were ordered and were unremarkable.  Additional cardiac work-up was not deemed necessary by the family medicine service.  He remained on IV  antibiotics well inpatient (cefepime) with the plan to start Levaquin 750 mg daily on discharge.  This medicine was given to him while in the hospital.  He was given perioperative antibiotics:  Anti-infectives (From admission, onward)   Start     Dose/Rate Route Frequency Ordered Stop   11/27/18 0000  levofloxacin (LEVAQUIN) 750 MG tablet  Status:  Discontinued     750 mg Oral Daily 11/27/18 1121 11/27/18    11/27/18 0000  levofloxacin (LEVAQUIN) 750 MG tablet     750 mg Oral Daily 11/27/18 1249 01/08/19 2359   11/25/18 1630  ceFEPIme (MAXIPIME) 2 g in sodium chloride 0.9 % 100 mL IVPB     2 g 200 mL/hr over 30 Minutes Intravenous Every 8 hours 11/25/18 1626      .  He was given sequential compression devices, early ambulation, and Lovenox for DVT prophylaxis.  He has an allergy to aspirin and after discussing the details risks and benefits, the patient elected to start 2 weeks of Eliquis 2.5 mg twice daily after discharge.  He benefited maximally from the hospital stay and there were no complications.    Recent vital signs:  Vitals:   11/27/18 0403 11/28/18 0444  BP: (!) 142/78 (!) 170/76  Pulse: (!) 45 (!) 55  Resp: 17 17  Temp: 97.6 F (36.4 C) 98.6 F (37 C)  SpO2: 99% 100%    Recent laboratory studies:  Lab Results  Component Value Date   HGB 11.8 (L) 11/27/2018  HGB 12.9 (L) 11/26/2018   HGB 15.2 11/25/2018   Lab Results  Component Value Date   WBC 7.1 11/27/2018   PLT 249 11/27/2018   Lab Results  Component Value Date   INR 1.01 04/24/2012   Lab Results  Component Value Date   NA 141 11/27/2018   K 4.8 11/27/2018   CL 110 11/27/2018   CO2 25 11/27/2018   BUN 13 11/27/2018   CREATININE 0.75 11/27/2018   GLUCOSE 108 (H) 11/27/2018    Discharge Medications:   Allergies as of 11/28/2018      Reactions   Penicillins Hives, Itching, Rash   Did it involve swelling of the face/tongue/throat, SOB, or low BP? No Did it involve sudden or severe rash/hives,  skin peeling, or any reaction on the inside of your mouth or nose? #  #  #  YES  #  #  #  Did you need to seek medical attention at a hospital or doctor's office? #  #  #  YES  #  #  #  When did it last happen?15-20 Years   Aspirin Hives, Itching, Rash   Tomato Hives   Onion Nausea And Vomiting      Medication List    STOP taking these medications   oxyCODONE-acetaminophen 10-325 MG tablet Commonly known as:  PERCOCET     TAKE these medications   albuterol 108 (90 Base) MCG/ACT inhaler Commonly known as:  VENTOLIN HFA Inhale 1 puff into the lungs every 6 (six) hours as needed for wheezing. For wheezing and shortness of breath What changed:    how much to take  additional instructions   apixaban 2.5 MG Tabs tablet Commonly known as:  ELIQUIS Take 1 tablet (2.5 mg total) by mouth 2 (two) times daily for 14 days. For postoperative DVT prophylaxis.   calcium carbonate 750 MG chewable tablet Commonly known as:  TUMS EX Chew 4 tablets by mouth daily as needed for heartburn.   carbamide peroxide 6.5 % OTIC solution Commonly known as:  Debrox Place 5 drops into the left ear 2 (two) times daily.   gabapentin 300 MG capsule Commonly known as:  NEURONTIN Take 2 capsules (600 mg total) by mouth 2 (two) times daily.   HYDROcodone-acetaminophen 5-325 MG tablet Commonly known as:  Norco Take 1-2 tablets by mouth every 6 (six) hours as needed for severe pain (Breakthrough pain not managed by Tramadol and Gabapentin).   ICY HOT EX Apply 1 application topically daily as needed (knee pain).   levofloxacin 750 MG tablet Commonly known as:  Levaquin Take 1 tablet (750 mg total) by mouth daily.   lisinopril 20 MG tablet Commonly known as:  ZESTRIL Take 1 tablet (20 mg total) by mouth every morning.   sodium chloride 0.65 % Soln nasal spray Commonly known as:  OCEAN Place 1 spray into both nostrils as needed for congestion.   traMADol 50 MG tablet Commonly known as:   ULTRAM Take one or two pills by mouth every 8 hours as needed for chronic knee pain What changed:    how much to take  how to take this  when to take this  reasons to take this  additional instructions       Diagnostic Studies: No results found.  Disposition: Discharge disposition: 01-Home or Self Care       Discharge Instructions    Discharge patient   Complete by:  As directed    Pending final recommendations by infectious disease (  antibiotic therapy) and/or any additional work-up family medicine for bradycardia.   Discharge disposition:  01-Home or Self Care   Discharge patient date:  11/27/2018   Discharge patient   Complete by:  As directed    Discharge disposition:  01-Home or Self Care   Discharge patient date:  11/28/2018      Follow-up Information    Renette Butters, MD Follow up.   Specialty:  Orthopedic Surgery Contact information: [address] [place] [address] [phone]        [facility] Follow up.   Specialty:  Rehabilitation Why:  they will call you to set up appointment, if you do not hear from them by Monday, please contact them to make your appointment. Contact information: [address] [phone] [facility] [place] for Infectious Disease Follow up on 12/08/2018.   Specialty:  Infectious Diseases Why:  Appointment with Colletta Maryland, NP @ 2:30 pm. Please arrive 15 min prior to your appointment.  Contact information: [place], [place] [phone] [place]           Signed: Prudencio Burly III PA-C 11/28/2018, 7:58 AM

## 2018-11-27 NOTE — Progress Notes (Signed)
Orthopedic Tech Progress Note Patient Details:  Brian Morgan 05/23/1946 [id number] Therapy called requesting a PRAFO BOOT for this patient to help with his foot from turning to the side. Ortho Devices Type of Ortho Device: Prafo boot/shoe Ortho Device/Splint Location: LLE Ortho Device/Splint Interventions: Adjustment, Application, Ordered   Post Interventions Patient Tolerated: Well Instructions Provided: Care of device, Adjustment of device   Janit Pagan 11/27/2018, 10:31 AM

## 2018-11-28 LAB — C-REACTIVE PROTEIN: CRP: 1.3 mg/dL — ABNORMAL HIGH (ref <1.0)

## 2018-11-28 LAB — TSH: TSH: 2.721 u[IU]/mL (ref 0.350–4.500)

## 2018-11-28 LAB — SEDIMENTATION RATE: Sed Rate: 25 mm/hr — ABNORMAL HIGH (ref 0–16)

## 2018-11-28 NOTE — Plan of Care (Signed)
  Problem: Clinical Measurements: Goal: Ability to maintain clinical measurements within normal limits will improve Outcome: Progressing   Problem: Activity: Goal: Risk for activity intolerance will decrease Outcome: Progressing   Problem: Elimination: Goal: Will not experience complications related to bowel motility Outcome: Adequate for Discharge   Problem: Pain Managment: Goal: General experience of comfort will improve Outcome: Progressing

## 2018-11-28 NOTE — Discharge Instructions (Signed)
Take antibiotic medication as recommended by your infectious disease team.  Keep leg elevated and apply ace wrap and/or ice to reduce pain and swelling.   Diet: As you were doing prior to hospitalization   Dressing:  You may remove dressings and shower over incisions 3 days after surgery.  Place clean Band-Aid over incisions.  Continue to use ace wrap for compression to reduce swelling.   Activity:  Increase activity slowly as tolerated, but follow the weight bearing instructions below.  The rules on driving is that you can not be taking narcotics while you drive, and you must feel in control of the vehicle.    Weight Bearing: As tolerated   To prevent constipation: you may use a stool softener such as -  Colace (over the counter) 100 mg by mouth twice a day  Drink plenty of fluids (prune juice may be helpful) and high fiber foods Miralax (over the counter) for constipation as needed.    Itching:  If you experience itching with your medications, try taking only a single pain pill, or even half a pain pill at a time.  You can also use benadryl over the counter for itching or also to help with sleep.   Precautions:  If you experience chest pain or shortness of breath - call 911 immediately for transfer to the hospital emergency department!!  If you develop a fever greater that 101 F, purulent drainage from wound, increased redness or drainage from wound, or calf pain -- Call the office at [phone]                                                 Follow- Up Appointment:  Please call for an appointment to be seen in 1-2 weeks [place] - [phone]  Information on my medicine - ELIQUIS (apixaban)  Why was Eliquis prescribed for you? Eliquis was prescribed for you to reduce the risk of blood clots forming after orthopedic surgery.    What do You need to know about Eliquis? Take your Eliquis TWICE DAILY - one tablet in the morning and one tablet in the evening with or without  food.  It would be best to take the dose about the same time each day.  If you have difficulty swallowing the tablet whole please discuss with your pharmacist how to take the medication safely.  Take Eliquis exactly as prescribed by your doctor and DO NOT stop taking Eliquis without talking to the doctor who prescribed the medication.  Stopping without other medication to take the place of Eliquis may increase your risk of developing a clot.  After discharge, you should have regular check-up appointments with your healthcare provider that is prescribing your Eliquis.  What do you do if you miss a dose? If a dose of ELIQUIS is not taken at the scheduled time, take it as soon as possible on the same day and twice-daily administration should be resumed.  The dose should not be doubled to make up for a missed dose.  Do not take more than one tablet of ELIQUIS at the same time.  Important Safety Information A possible side effect of Eliquis is bleeding. You should call your healthcare provider right away if you experience any of the following: ? Bleeding from an injury or your nose that does not stop. ? Unusual colored urine (red or  dark brown) or unusual colored stools (red or black). ? Unusual bruising for unknown reasons. ? A serious fall or if you hit your head (even if there is no bleeding).  Some medicines may interact with Eliquis and might increase your risk of bleeding or clotting while on Eliquis. To help avoid this, consult your healthcare provider or pharmacist prior to using any new prescription or non-prescription medications, including herbals, vitamins, non-steroidal anti-inflammatory drugs (NSAIDs) and supplements.  This website has more information on Eliquis (apixaban): http://www.eliquis.com/eliquis/home

## 2018-11-28 NOTE — Progress Notes (Signed)
CSW called ito patient's room to inquire regarding any transportation needs home. Patient reports he has transportation through [facility] and he has called them in which they report they will be about an hour to pick him up. Patient states he will call them when he's ready, CSW asked if patient wanted CSW to call. He reports no, he will call for transport and have it set up.   Please notify CSW for any further needs.   [place], [place]

## 2018-11-28 NOTE — TOC Benefit Eligibility Note (Signed)
Transition of Care [facility]) Benefit Eligibility Note    Patient Details  Name: Brian Morgan MRN: [id number] Date of Birth: 04-28-1946   Medication/Dose: Arne Cleveland  2.5 MG BID  Covered?: Yes     Prescription Coverage Preferred Pharmacy: YES([facility])  Spoke with Person/Company/Phone Number:: [id number] ([facility] # [phone])  Co-Pay: $ 3.90  Prior Approval: No  Deductible: Met  Additional Notes: SECONDARY INS: MEDICAID OF [place] ACCESS(EFF-DATE: 04-06-2018  CO-PAY- $3.90 FOR EACH PRESCRIPTION)    Memory Argue Phone Number: 11/28/2018, 10:01 AM

## 2018-11-28 NOTE — Consult Note (Signed)
Patient's TSH is WNL.  FPTS will sign off.  [place] Constable, D.O.  PGY-1 Family Medicine  11/28/2018 7:30 AM

## 2018-11-28 NOTE — Progress Notes (Signed)
Physical Therapy Treatment Patient Details Name: Brian Morgan MRN: [id number] DOB: [birth year] Today's Date: 11/28/2018    History of Present Illness 73 yo admitted for left knee arthoscopy 4/21 due to chronic infection with recent aspiration yielding pseudomonas. Trauma to left knee in the 1980s without prior surgery.Marland Kitchen PMhx: HTN, hep C, asthma    PT Comments    Pt performed gait training short bout in room with continued decreased knee extension and dorsiflexion in stance phase.  Pt resting with LLE ER and knee flexed.  Educated on importance of resting in [facility] boot to achieve terminal knee extension.  Pt participated in donning and doffing brace.  Plan for home this pm.  Reviewed HEP for home use.      Follow Up Recommendations  Outpatient PT(Refusing HHPT and reports he can provide assistance to and from home.  )     Equipment Recommendations  None recommended by PT    Recommendations for Other Services       Precautions / Restrictions Precautions Precautions: Fall Restrictions Weight Bearing Restrictions: Yes LLE Weight Bearing: Weight bearing as tolerated    Mobility  Bed Mobility Overal bed mobility: Modified Independent             General bed mobility comments: No assistance needed.    Transfers Overall transfer level: Modified independent   Transfers: Sit to/from Stand Sit to Stand: Modified independent (Device/Increase time)         General transfer comment: cues for hand placement and safety  Ambulation/Gait Ambulation/Gait assistance: Supervision Gait Distance (Feet): 40 Feet Assistive device: Rolling walker (2 wheeled) Gait Pattern/deviations: Step-to pattern;Decreased stance time - left;Decreased dorsiflexion - left     General Gait Details: Pt continues to require cues for sequencing, L heel strike and L weight shifting.  He ambulates on ball of his foot despite cues to correct.  He lack knee extension to achieve heel strike.     Stairs             Wheelchair Mobility    Modified Rankin (Stroke Patients Only)       Balance Overall balance assessment: Needs assistance   Sitting balance-Leahy Scale: Good       Standing balance-Leahy Scale: Poor Standing balance comment: bil UE support in standing due to LLE pain and weakness                            Cognition Arousal/Alertness: Awake/alert Behavior During Therapy: WFL for tasks assessed/performed Overall Cognitive Status: Within Functional Limits for tasks assessed                                        Exercises General Exercises - Lower Extremity Ankle Circles/Pumps: AROM;15 reps;Seated;Left Quad Sets: AROM;10 reps;Seated;Left Long Arc Quad: AAROM;10 reps;Seated;Left Heel Slides: AAROM;10 reps;Supine;Left Straight Leg Raises: AAROM;10 reps;Supine;Left Other Exercises Other Exercises: educated on self HS stretch with gait belt x 30 sec hold.  Issued gait belt for use at home.      General Comments        Pertinent Vitals/Pain Pain Assessment: 0-10 Pain Score: 5  Pain Location: left knee Pain Descriptors / Indicators: Aching Pain Intervention(s): Monitored during session;Repositioned    Home Living                      Prior Function  PT Goals (current goals can now be found in the care plan section) Acute Rehab PT Goals Patient Stated Goal: go fishing (hasn't fished in 2 years since knee issues began) Progress towards PT goals: Progressing toward goals    Frequency    Min 5X/week      PT Plan Current plan remains appropriate    Co-evaluation              AM-PAC PT "6 Clicks" Mobility   Outcome Measure  Help needed turning from your back to your side while in a flat bed without using bedrails?: None Help needed moving from lying on your back to sitting on the side of a flat bed without using bedrails?: None Help needed moving to and from a bed to a chair  (including a wheelchair)?: None Help needed standing up from a chair using your arms (e.g., wheelchair or bedside chair)?: None Help needed to walk in hospital room?: A Little Help needed climbing 3-5 steps with a railing? : A Little 6 Click Score: 22    End of Session Equipment Utilized During Treatment: Gait belt   Patient left: in bed;with call bell/phone within reach Nurse Communication: Mobility status;Patient requests pain meds PT Visit Diagnosis: Other abnormalities of gait and mobility (R26.89);Muscle weakness (generalized) (M62.81)     Time: 5974-7185 PT Time Calculation (min) (ACUTE ONLY): 23 min  Charges:  $Gait Training: 8-22 mins $Therapeutic Exercise: 8-22 mins                     Governor Rooks, PTA Acute Rehabilitation Services Pager [phone] Office [phone]     Ahsley Attwood Eli Hose 11/28/2018, 1:17 PM

## 2018-11-28 NOTE — Progress Notes (Signed)
Subjective: Patient reports pain continues to improve. Denies chest pain.  Denies fever.  Tolerating diet.  Urinating.  Mobilizing well independently.  Objective:   VITALS:   Vitals:   11/26/18 1507 11/26/18 1958 11/27/18 0403 11/28/18 0444  BP: 122/70 123/79 (!) 142/78 (!) 170/76  Pulse: (!) 55 (!) 51 (!) 45 (!) 55  Resp: 16 16 17 17   Temp:  (!) 97.4 F (36.3 C) 97.6 F (36.4 C) 98.6 F (37 C)  TempSrc:  Oral Oral Oral  SpO2: 98% 100% 99% 100%  Weight:      Height:       CBC Latest Ref Rng & Units 11/27/2018 11/26/2018 11/25/2018  WBC 4.0 - 10.5 K/uL 7.1 - 8.8  Hemoglobin 13.0 - 17.0 g/dL 11.8(L) 12.9(L) 15.2  Hematocrit 39.0 - 52.0 % 36.4(L) 38.4(L) 46.2  Platelets 150 - 400 K/uL 249 - 318   BMP Latest Ref Rng & Units 11/27/2018 11/26/2018 11/25/2018  Glucose 70 - 99 mg/dL 108(H) 158(H) 107(H)  BUN 8 - 23 mg/dL 13 19 8   Creatinine 0.61 - 1.24 mg/dL 0.75 0.55(L) 0.92  Sodium 135 - 145 mmol/L 141 137 134(L)  Potassium 3.5 - 5.1 mmol/L 4.8 4.1 3.5  Chloride 98 - 111 mmol/L 110 103 98  CO2 22 - 32 mmol/L 25 23 24   Calcium 8.9 - 10.3 mg/dL 8.0(L) 8.6(L) 8.8(L)   Intake/Output      04/23 0701 - 04/24 0700 04/24 0701 - 04/25 0700   P.O. 1255    I.V. (mL/kg)     IV Piggyback 337    Total Intake(mL/kg) 1592 (24.5)    Urine (mL/kg/hr) 2150 (1.4)    Stool 0    Total Output 2150    Net -558         Urine Occurrence 401 x    Stool Occurrence 1 x       Physical Exam: General: NAD.  Upright in bed eating breakfast.  Calm, conversant Resp: No increased wob Cardio: Mildly bradycardic.  Regular rhythm ABD soft Neurologically intact MSK LLE: Neurovascularly intact Sensation intact distally Feet warm Dorsiflexion/Plantar flexion intact Incision: dressing C/D/I   Assessment: 3 Days Post-Op  S/P Procedure(s) (LRB): ARTHROSCOPY LEFT KNEE INCISION AND DRAINAGE WITH LAVAGE AND ARTHROSCOPIC SYNOVECTOMY AND CHONDRAPLASTY (Left) by Dr. Ernesta Amble. Percell Miller on  11/25/2018  Principal Problem:   Chronic infection of left knee [facility]) Active Problems:   Knee pain, left   Bradycardia   Chronic left knee infection status post I&D/lavage, synovectomy and chondroplasty -Antibiotic therapy initiated by infectious disease - cefepime 2 g IV every 8 hours.  Planning for 6 weeks of Levaquin 750 mg daily after discharge.  Physical prescription given to patient yesterday. -Definitive treatment likely will require significant bone resection, and cement interposition.  Dr. Percell Miller has discussed this with Dr. Lauris Poag in [place] who performs this surgery.  For now, suppression prior to staged definitive surgery needed. -Follow-up appointment with infectious disease 12/08/2018.  Bradycardia: -Evaluated by family medicine service  Labs, EKG, echo ordered - unremarkable, additional work-up not deemed necessary.  Family medicine has signed off.  HTN, COPD Continue home medicines   Plan: Continue ABX therapy due to chronic infection per ID recommendations Incentive Spirometry Elevate and Apply ice  Weightbearing: WBAT LLE Insicional and dressing care: Reinforce PRN VTE prophylaxis: Lovenox 40mg  qd, SCDs, ambulation.  Plan for 2 weeks of Eliquis after discharge.  Details risks and benefits of same discussed with patient.  He verbalized understanding and agrees  with this plan.  Physical prescription provided while inpatient.  He has an allergy to aspirin (hives). Pain control: Continue chronic tramadol, gabapentin.  Oxycodone - for breakthrough pain.  Contact information:  Edmonia Lynch MD, Roxan Hockey PA-C  Dispo: To Home this morning.    Anticipated LOS equal to or greater than 2 midnights due to - Age 72 and older with one or more of the following:  - Obesity  - Expected need for hospital services (PT, OT, Nursing) required for safe  discharge  - Anticipated need for postoperative skilled nursing care or inpatient rehab   Brian Burly  III, PA-C 11/28/2018, 7:46 AM

## 2018-11-29 LAB — HEPATITIS C VRS RNA DETECT BY PCR-QUAL: Hepatitis C Vrs RNA by PCR-Qual: POSITIVE — AB

## 2018-11-30 LAB — AEROBIC/ANAEROBIC CULTURE W GRAM STAIN (SURGICAL/DEEP WOUND)

## 2018-12-01 ENCOUNTER — Encounter: Payer: Self-pay | Admitting: Physical Medicine & Rehabilitation

## 2018-12-02 ENCOUNTER — Telehealth: Payer: Self-pay | Admitting: Physical Therapy

## 2018-12-02 ENCOUNTER — Encounter: Payer: Self-pay | Admitting: Physical Therapy

## 2018-12-02 NOTE — Telephone Encounter (Signed)
Received order from Dr Dorris Singh for evaluation. Contacted Dr Percell Miller who stated the patient can wait to start therapy until after his next procedure. Therapy contacted patient who is in agreement that this is the best plan.

## 2018-12-04 ENCOUNTER — Telehealth: Payer: Self-pay | Admitting: Infectious Diseases

## 2018-12-04 NOTE — Telephone Encounter (Signed)
COVID-19 Pre-Screening Questions: ° °Do you currently have a fever (>100 °F), chills or unexplained body aches? No  ° °Are you currently experiencing new cough, shortness of breath, sore throat, runny nose? No  °•  °Have you recently travelled outside the state of [place] in the last 14 days? No  °•  °1. Have you been in contact with someone that is currently pending confirmation of Covid19 testing or has been confirmed to have the Covid19 virus?  No  ° °

## 2018-12-08 ENCOUNTER — Inpatient Hospital Stay: Payer: 59 | Admitting: Infectious Diseases

## 2018-12-09 ENCOUNTER — Inpatient Hospital Stay: Payer: 59 | Admitting: Infectious Diseases

## 2018-12-18 ENCOUNTER — Inpatient Hospital Stay: Payer: 59 | Admitting: Infectious Diseases

## 2018-12-23 ENCOUNTER — Other Ambulatory Visit: Payer: Self-pay

## 2018-12-23 ENCOUNTER — Encounter: Payer: Self-pay | Admitting: Physical Medicine & Rehabilitation

## 2018-12-23 ENCOUNTER — Encounter: Payer: 59 | Attending: Physical Medicine & Rehabilitation | Admitting: Physical Medicine & Rehabilitation

## 2018-12-23 VITALS — BP 130/77 | HR 65 | Temp 98.1°F | Ht 72.0 in | Wt 143.0 lb

## 2018-12-23 DIAGNOSIS — M47816 Spondylosis without myelopathy or radiculopathy, lumbar region: Secondary | ICD-10-CM
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
## 2018-12-23 NOTE — Patient Instructions (Signed)
No need to stop Eliquis   Will call in Valium 10mg  prior to procedure, will call in to pharmacy, take when you get to this clinic

## 2018-12-30 ENCOUNTER — Inpatient Hospital Stay: Payer: 59 | Admitting: Infectious Diseases

## 2019-01-12 ENCOUNTER — Encounter: Payer: Self-pay | Admitting: Physical Medicine & Rehabilitation

## 2019-01-12 ENCOUNTER — Encounter: Payer: 59 | Attending: Physical Medicine & Rehabilitation | Admitting: Physical Medicine & Rehabilitation

## 2019-01-12 ENCOUNTER — Other Ambulatory Visit: Payer: Self-pay

## 2019-01-12 VITALS — BP 170/100 | HR 63 | Temp 99.2°F | Ht 72.0 in | Wt 144.0 lb

## 2019-01-12 DIAGNOSIS — M47816 Spondylosis without myelopathy or radiculopathy, lumbar region: Secondary | ICD-10-CM | POA: Insufficient documentation

## 2019-01-12 NOTE — Progress Notes (Signed)
Bilateral Lumbar L3, L4  medial branch blocks and L 5 dorsal ramus injection under fluoroscopic guidance  Indication: Lumbar pain which is not relieved by medication management or other conservative care and interfering with self-care and mobility.  Informed consent was obtained after describing risks and benefits of the procedure with the patient, this includes bleeding, infection, paralysis and medication side effects.  The patient wishes to proceed and has given written consent.  The patient was placed in prone position.  The lumbar area was marked and prepped with Betadine.  One mL of 1% lidocaine was injected into each of 6 areas into the skin and subcutaneous tissue.  Then a 22-gauge 3.5in spinal needle was inserted targeting the junction of the left S1 superior articular process and sacral ala junction. Needle was advanced under fluoroscopic guidance.  Bone contact was made.  Isovue 200 was injected x 0.5 mL demonstrating no intravascular uptake.  Then a solution  of 2% MPF lidocaine was injected x 0.5 mL.  Then the left L5 superior articular process in transverse process junction was targeted.  Bone contact was made.  Isovue 200 was injected x 0.5 mL demonstrating no intravascular uptake. Then a solution containing  2% MPF lidocaine was injected x 0.5 mL.  Then the left L4 superior articular process in transverse process junction was targeted.  Bone contact was made.  Isovue 200 was injected x 0.5 mL demonstrating no intravascular uptake.  Then a solution containing2% MPF lidocaine was injected x 0.5 mL.  This same procedure was performed on the right side using the same needle, technique and injectate.  Patient tolerated procedure well.  Post procedure instructions were given.  

## 2019-01-12 NOTE — Patient Instructions (Signed)

## 2019-01-12 NOTE — Progress Notes (Signed)
  PROCEDURE RECORD  Physical Medicine and Rehabilitation   Name: JOBANI SABADO DOB:1945/10/24 MRN: [id number]  Date:01/12/2019  Physician: Alysia Penna, MD    Nurse/CMA: Jada Kuhnert CMA / Truman Hayward CMA  Allergies:  Allergies  Allergen Reactions  . Penicillins Hives, Itching and Rash    Did it involve swelling of the face/tongue/throat, SOB, or low BP? No Did it involve sudden or severe rash/hives, skin peeling, or any reaction on the inside of your mouth or nose? #  #  #  YES  #  #  #  Did you need to seek medical attention at a hospital or doctor's office? #  #  #  YES  #  #  #  When did it last happen?15-20 Years   . Aspirin Hives, Itching and Rash  . Tomato Hives  . Onion Nausea And Vomiting    Consent Signed: Yes.    Is patient diabetic? No.  CBG today? NA  Pregnant: No. LMP: No LMP for male patient. (age 38-55)  Anticoagulants: no Anti-inflammatory: no Antibiotics: no  Procedure: Bilateral L3-5 MBB  Position: Prone  Start time: 12:09 pm End Time: 12:22pm  Fluoro Time:52   RN/CMA Suzanne Kho CMA Lee, CMA    Time 1133am 12:29pm    BP 170/100 165/96    Pulse 63 82    Respirations 16 16    O2 Sat 97 93    S/S 6 6    Pain Level 8/10 3/10     D/C home with self transport, patient A & O X 3, D/C instructions reviewed, and sits independently.

## 2019-02-02 ENCOUNTER — Telehealth: Payer: Self-pay | Admitting: Physical Medicine & Rehabilitation

## 2019-02-02 NOTE — Telephone Encounter (Signed)
Dr Letta Pate please confirm if this patient is supposed to be scheduled for an MBB or a Rf Ablation?  They both appear in the last note.

## 2019-02-04 NOTE — Telephone Encounter (Signed)
Only has had one set of MBB Will repeat MBB  I think I'll need to do inservice on procedures again

## 2019-02-13 ENCOUNTER — Encounter: Payer: 59 | Attending: Physical Medicine & Rehabilitation | Admitting: Physical Medicine & Rehabilitation

## 2019-02-13 DIAGNOSIS — M47816 Spondylosis without myelopathy or radiculopathy, lumbar region: Secondary | ICD-10-CM | POA: Insufficient documentation

## 2019-02-28 ENCOUNTER — Emergency Department (HOSPITAL_COMMUNITY)
Admission: EM | Admit: 2019-02-28 | Discharge: 2019-03-01 | Disposition: A | Discharge status: Home / Self Care | Payer: 59 | Attending: Emergency Medicine | Admitting: Emergency Medicine

## 2019-02-28 ENCOUNTER — Emergency Department (HOSPITAL_COMMUNITY): Payer: 59

## 2019-02-28 ENCOUNTER — Other Ambulatory Visit: Payer: Self-pay

## 2019-02-28 DIAGNOSIS — F17210 Nicotine dependence, cigarettes, uncomplicated: Secondary | ICD-10-CM | POA: Diagnosis not present

## 2019-02-28 DIAGNOSIS — Z79899 Other long term (current) drug therapy: Secondary | ICD-10-CM | POA: Diagnosis not present

## 2019-02-28 DIAGNOSIS — Z20828 Contact with and (suspected) exposure to other viral communicable diseases: Secondary | ICD-10-CM | POA: Diagnosis not present

## 2019-02-28 DIAGNOSIS — J449 Chronic obstructive pulmonary disease, unspecified: Secondary | ICD-10-CM | POA: Diagnosis not present

## 2019-02-28 DIAGNOSIS — I10 Essential (primary) hypertension: Secondary | ICD-10-CM | POA: Insufficient documentation

## 2019-02-28 DIAGNOSIS — J45909 Unspecified asthma, uncomplicated: Secondary | ICD-10-CM | POA: Diagnosis not present

## 2019-02-28 DIAGNOSIS — R5383 Other fatigue: Secondary | ICD-10-CM | POA: Diagnosis not present

## 2019-02-28 LAB — COMPREHENSIVE METABOLIC PANEL
ALT: 16 U/L (ref 0–44)
AST: 25 U/L (ref 15–41)
Albumin: 3.2 g/dL — ABNORMAL LOW (ref 3.5–5.0)
Alkaline Phosphatase: 41 U/L (ref 38–126)
Anion gap: 12 (ref 5–15)
BUN: 8 mg/dL (ref 8–23)
CO2: 22 mmol/L (ref 22–32)
Calcium: 9.2 mg/dL (ref 8.9–10.3)
Chloride: 98 mmol/L (ref 98–111)
Creatinine, Ser: 0.86 mg/dL (ref 0.61–1.24)
GFR calc Af Amer: >60 mL/min (ref >60)
GFR calc non Af Amer: >60 mL/min (ref >60)
Glucose, Bld: 136 mg/dL — ABNORMAL HIGH (ref 70–99)
Potassium: 3.7 mmol/L (ref 3.5–5.1)
Sodium: 132 mmol/L — ABNORMAL LOW (ref 135–145)
Total Bilirubin: 1.2 mg/dL (ref 0.3–1.2)
Total Protein: 7 g/dL (ref 6.5–8.1)

## 2019-02-28 LAB — CBC WITH DIFFERENTIAL/PLATELET
Abs Immature Granulocytes: 0.02 10*3/uL (ref 0.00–0.07)
Basophils Absolute: 0 10*3/uL (ref 0.0–0.1)
Basophils Relative: 0 %
Eosinophils Absolute: 0 10*3/uL (ref 0.0–0.5)
Eosinophils Relative: 0 %
HCT: 41.7 % (ref 39.0–52.0)
Hemoglobin: 14.2 g/dL (ref 13.0–17.0)
Immature Granulocytes: 0 %
Lymphocytes Relative: 16 %
Lymphs Abs: 1.2 10*3/uL (ref 0.7–4.0)
MCH: 33.8 pg (ref 26.0–34.0)
MCHC: 34.1 g/dL (ref 30.0–36.0)
MCV: 99.3 fL (ref 80.0–100.0)
Monocytes Absolute: 0.6 10*3/uL (ref 0.1–1.0)
Monocytes Relative: 9 %
Neutro Abs: 5.6 10*3/uL (ref 1.7–7.7)
Neutrophils Relative %: 75 %
Platelets: 158 10*3/uL (ref 150–400)
RBC: 4.2 MIL/uL — ABNORMAL LOW (ref 4.22–5.81)
RDW: 12.8 % (ref 11.5–15.5)
WBC: 7.4 10*3/uL (ref 4.0–10.5)
nRBC: 0 % (ref 0.0–0.2)

## 2019-02-28 MED ORDER — SODIUM CHLORIDE 0.9 % IV BOLUS
1000.0000 mL | Freq: Once | INTRAVENOUS | Status: AC
  Administered 2019-02-28: 16:00:00 1000 mL via INTRAVENOUS

## 2019-02-28 MED ORDER — SODIUM CHLORIDE 0.9 % IV BOLUS
1000.0000 mL | Freq: Once | INTRAVENOUS | Status: AC
  Administered 2019-02-28: 1000 mL via INTRAVENOUS

## 2019-02-28 MED ORDER — ACETAMINOPHEN 325 MG PO TABS
650.0000 mg | ORAL_TABLET | Freq: Once | ORAL | Status: DC
  Filled 2019-02-28: qty 2

## 2019-02-28 NOTE — Discharge Instructions (Signed)
We will contact you with the results of your remaining lab work when it is available. Follow-up with your primary care provider. Take Tylenol as needed. Return to ED if you start to develop chest pain, abdominal pain, vomiting, leg swelling or shortness of breath.
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
## 2019-02-28 NOTE — ED Triage Notes (Signed)
Pt. Stated, Im just tired and don't feel good . I also feel like Im running a fever. This all started this morning.

## 2019-02-28 NOTE — ED Notes (Signed)
Pt returned from xray

## 2019-03-02 LAB — NOVEL CORONAVIRUS, NAA (HOSP ORDER, SEND-OUT TO REF LAB; TAT 18-24 HRS): SARS-CoV-2, NAA: NOT DETECTED

## 2019-03-19 DIAGNOSIS — Z760 Encounter for issue of repeat prescription: Secondary | ICD-10-CM | POA: Diagnosis not present

## 2019-03-19 DIAGNOSIS — M5136 Other intervertebral disc degeneration, lumbar region: Secondary | ICD-10-CM | POA: Diagnosis not present

## 2019-03-19 DIAGNOSIS — G894 Chronic pain syndrome: Secondary | ICD-10-CM | POA: Diagnosis not present

## 2019-03-19 DIAGNOSIS — Z79899 Other long term (current) drug therapy: Secondary | ICD-10-CM | POA: Diagnosis not present

## 2019-03-31 ENCOUNTER — Other Ambulatory Visit: Payer: Self-pay

## 2019-03-31 ENCOUNTER — Ambulatory Visit (INDEPENDENT_AMBULATORY_CARE_PROVIDER_SITE_OTHER): Payer: Self-pay | Admitting: Family Medicine

## 2019-03-31 ENCOUNTER — Encounter: Payer: Self-pay | Admitting: Family Medicine

## 2019-03-31 DIAGNOSIS — M5430 Sciatica, unspecified side: Secondary | ICD-10-CM

## 2019-03-31 DIAGNOSIS — M25531 Pain in right wrist: Secondary | ICD-10-CM

## 2019-03-31 MED ORDER — GABAPENTIN 300 MG PO CAPS: 600.0000 mg | ORAL_CAPSULE | Freq: Two times a day (BID) | ORAL | 2 refills | Status: AC
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
## 2019-03-31 NOTE — Assessment & Plan Note (Addendum)
Some concern for distal radial fracture based on tenderness and swelling. No tenderness at anatomical snuff box which is reassuring.  - right wrist complete x-ray  - Pending x-ray results will refer to orthopedics vs sports medicine for further management. Patient is currently followed by both - On chronic Morphine and Percocet by pain management clinic. PDMP aware reviewed and no red flags. Recommend treatment of further break through pain with OTC ibuprofen and Voltaren gel/Icey-hot as needed - consider trial of Mobic if needed  - Recommend ice/heat 2-3 times/day for swelling - RTC pending results

## 2019-03-31 NOTE — Patient Instructions (Addendum)
Thank you so much for coming in to see me.  I have placed an order to have x-ray of your right wrist. I will let you know the results and next steps when they arrive.  You can use over the counter ibuprofen and voltaren gel for pain control. Ice and heat 2-3x/day.   Please let me know if there is anything else I can do for you.  Take care, Dr. Tarry Kos
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
## 2019-04-17 DIAGNOSIS — M1710 Unilateral primary osteoarthritis, unspecified knee: Secondary | ICD-10-CM | POA: Diagnosis not present

## 2019-04-17 DIAGNOSIS — Z79899 Other long term (current) drug therapy: Secondary | ICD-10-CM | POA: Diagnosis not present

## 2019-04-17 DIAGNOSIS — M5136 Other intervertebral disc degeneration, lumbar region: Secondary | ICD-10-CM | POA: Diagnosis not present

## 2019-04-17 DIAGNOSIS — Z79891 Long term (current) use of opiate analgesic: Secondary | ICD-10-CM | POA: Diagnosis not present

## 2019-04-17 DIAGNOSIS — G894 Chronic pain syndrome: Secondary | ICD-10-CM | POA: Diagnosis not present

## 2019-04-30 ENCOUNTER — Ambulatory Visit (HOSPITAL_COMMUNITY)
Admission: EM | Admit: 2019-04-30 | Discharge: 2019-04-30 | Disposition: A | Discharge status: Home / Self Care | Payer: Medicare Other | Attending: Family Medicine | Admitting: Family Medicine

## 2019-04-30 ENCOUNTER — Other Ambulatory Visit: Payer: Self-pay

## 2019-04-30 ENCOUNTER — Encounter (HOSPITAL_COMMUNITY): Payer: Self-pay

## 2019-04-30 DIAGNOSIS — R609 Edema, unspecified: Secondary | ICD-10-CM | POA: Diagnosis not present

## 2019-04-30 LAB — COMPREHENSIVE METABOLIC PANEL
ALT: 35 U/L (ref 0–44)
AST: 42 U/L — ABNORMAL HIGH (ref 15–41)
Albumin: 3.2 g/dL — ABNORMAL LOW (ref 3.5–5.0)
Alkaline Phosphatase: 58 U/L (ref 38–126)
Anion gap: 9 (ref 5–15)
BUN: 6 mg/dL — ABNORMAL LOW (ref 8–23)
CO2: 27 mmol/L (ref 22–32)
Calcium: 8.5 mg/dL — ABNORMAL LOW (ref 8.9–10.3)
Chloride: 103 mmol/L (ref 98–111)
Creatinine, Ser: 0.84 mg/dL (ref 0.61–1.24)
GFR calc Af Amer: >60 mL/min (ref >60)
GFR calc non Af Amer: >60 mL/min (ref >60)
Glucose, Bld: 114 mg/dL — ABNORMAL HIGH (ref 70–99)
Potassium: 4 mmol/L (ref 3.5–5.1)
Sodium: 139 mmol/L (ref 135–145)
Total Bilirubin: 0.7 mg/dL (ref 0.3–1.2)
Total Protein: 6.7 g/dL (ref 6.5–8.1)

## 2019-04-30 LAB — CBC WITH DIFFERENTIAL/PLATELET
Abs Immature Granulocytes: 0.03 10*3/uL (ref 0.00–0.07)
Basophils Absolute: 0 10*3/uL (ref 0.0–0.1)
Basophils Relative: 0 %
Eosinophils Absolute: 0.2 10*3/uL (ref 0.0–0.5)
Eosinophils Relative: 3 %
HCT: 37.7 % — ABNORMAL LOW (ref 39.0–52.0)
Hemoglobin: 12.7 g/dL — ABNORMAL LOW (ref 13.0–17.0)
Immature Granulocytes: 1 %
Lymphocytes Relative: 22 %
Lymphs Abs: 1.2 10*3/uL (ref 0.7–4.0)
MCH: 33.2 pg (ref 26.0–34.0)
MCHC: 33.7 g/dL (ref 30.0–36.0)
MCV: 98.7 fL (ref 80.0–100.0)
Monocytes Absolute: 0.5 10*3/uL (ref 0.1–1.0)
Monocytes Relative: 9 %
Neutro Abs: 3.5 10*3/uL (ref 1.7–7.7)
Neutrophils Relative %: 65 %
Platelets: 211 10*3/uL (ref 150–400)
RBC: 3.82 MIL/uL — ABNORMAL LOW (ref 4.22–5.81)
RDW: 14.1 % (ref 11.5–15.5)
WBC: 5.4 10*3/uL (ref 4.0–10.5)
nRBC: 0 % (ref 0.0–0.2)

## 2019-04-30 MED ORDER — FUROSEMIDE 20 MG PO TABS: 20.0000 mg | ORAL_TABLET | Freq: Every day | ORAL | 0 refills | Status: DC

## 2019-04-30 NOTE — ED Triage Notes (Signed)
Pt states his feet has been swollen for 1 week.

## 2019-04-30 NOTE — Discharge Instructions (Addendum)
We are checking some basic lab work to look at your kidneys and electrolytes.  Will call with any abnormal results. Make sure you are elevating the feet and recommend using compression stockings. Do not sit or stand for long periods of time. If your lab work is normal I will send in  medication to help with the fluid on your lower legs and feet. No concern for blood clot at this time.  If your symptoms worsen despite treatment over the weekend you need to follow-up.
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
## 2019-05-19 DIAGNOSIS — Z7189 Other specified counseling: Secondary | ICD-10-CM | POA: Diagnosis not present

## 2019-05-19 DIAGNOSIS — M1710 Unilateral primary osteoarthritis, unspecified knee: Secondary | ICD-10-CM | POA: Diagnosis not present

## 2019-05-19 DIAGNOSIS — M5136 Other intervertebral disc degeneration, lumbar region: Secondary | ICD-10-CM | POA: Diagnosis not present

## 2019-05-19 DIAGNOSIS — Z79899 Other long term (current) drug therapy: Secondary | ICD-10-CM | POA: Diagnosis not present

## 2019-05-19 DIAGNOSIS — G894 Chronic pain syndrome: Secondary | ICD-10-CM | POA: Diagnosis not present

## 2019-06-19 DIAGNOSIS — G894 Chronic pain syndrome: Secondary | ICD-10-CM | POA: Diagnosis not present

## 2019-06-19 DIAGNOSIS — Z79899 Other long term (current) drug therapy: Secondary | ICD-10-CM | POA: Diagnosis not present

## 2019-06-19 DIAGNOSIS — M1710 Unilateral primary osteoarthritis, unspecified knee: Secondary | ICD-10-CM | POA: Diagnosis not present

## 2019-06-19 DIAGNOSIS — M5136 Other intervertebral disc degeneration, lumbar region: Secondary | ICD-10-CM | POA: Diagnosis not present

## 2019-07-20 DIAGNOSIS — M1710 Unilateral primary osteoarthritis, unspecified knee: Secondary | ICD-10-CM | POA: Diagnosis not present

## 2019-07-20 DIAGNOSIS — G894 Chronic pain syndrome: Secondary | ICD-10-CM | POA: Diagnosis not present

## 2019-07-20 DIAGNOSIS — Z79899 Other long term (current) drug therapy: Secondary | ICD-10-CM | POA: Diagnosis not present

## 2019-08-04 IMAGING — CR DG CHEST 2V
2 series · 2 of 2 positions shown · non-contrast
Comparison: Radiographs November 24, 2012.

CLINICAL DATA: Syncope.

EXAM:
CHEST - 2 VIEW

[chest lat]
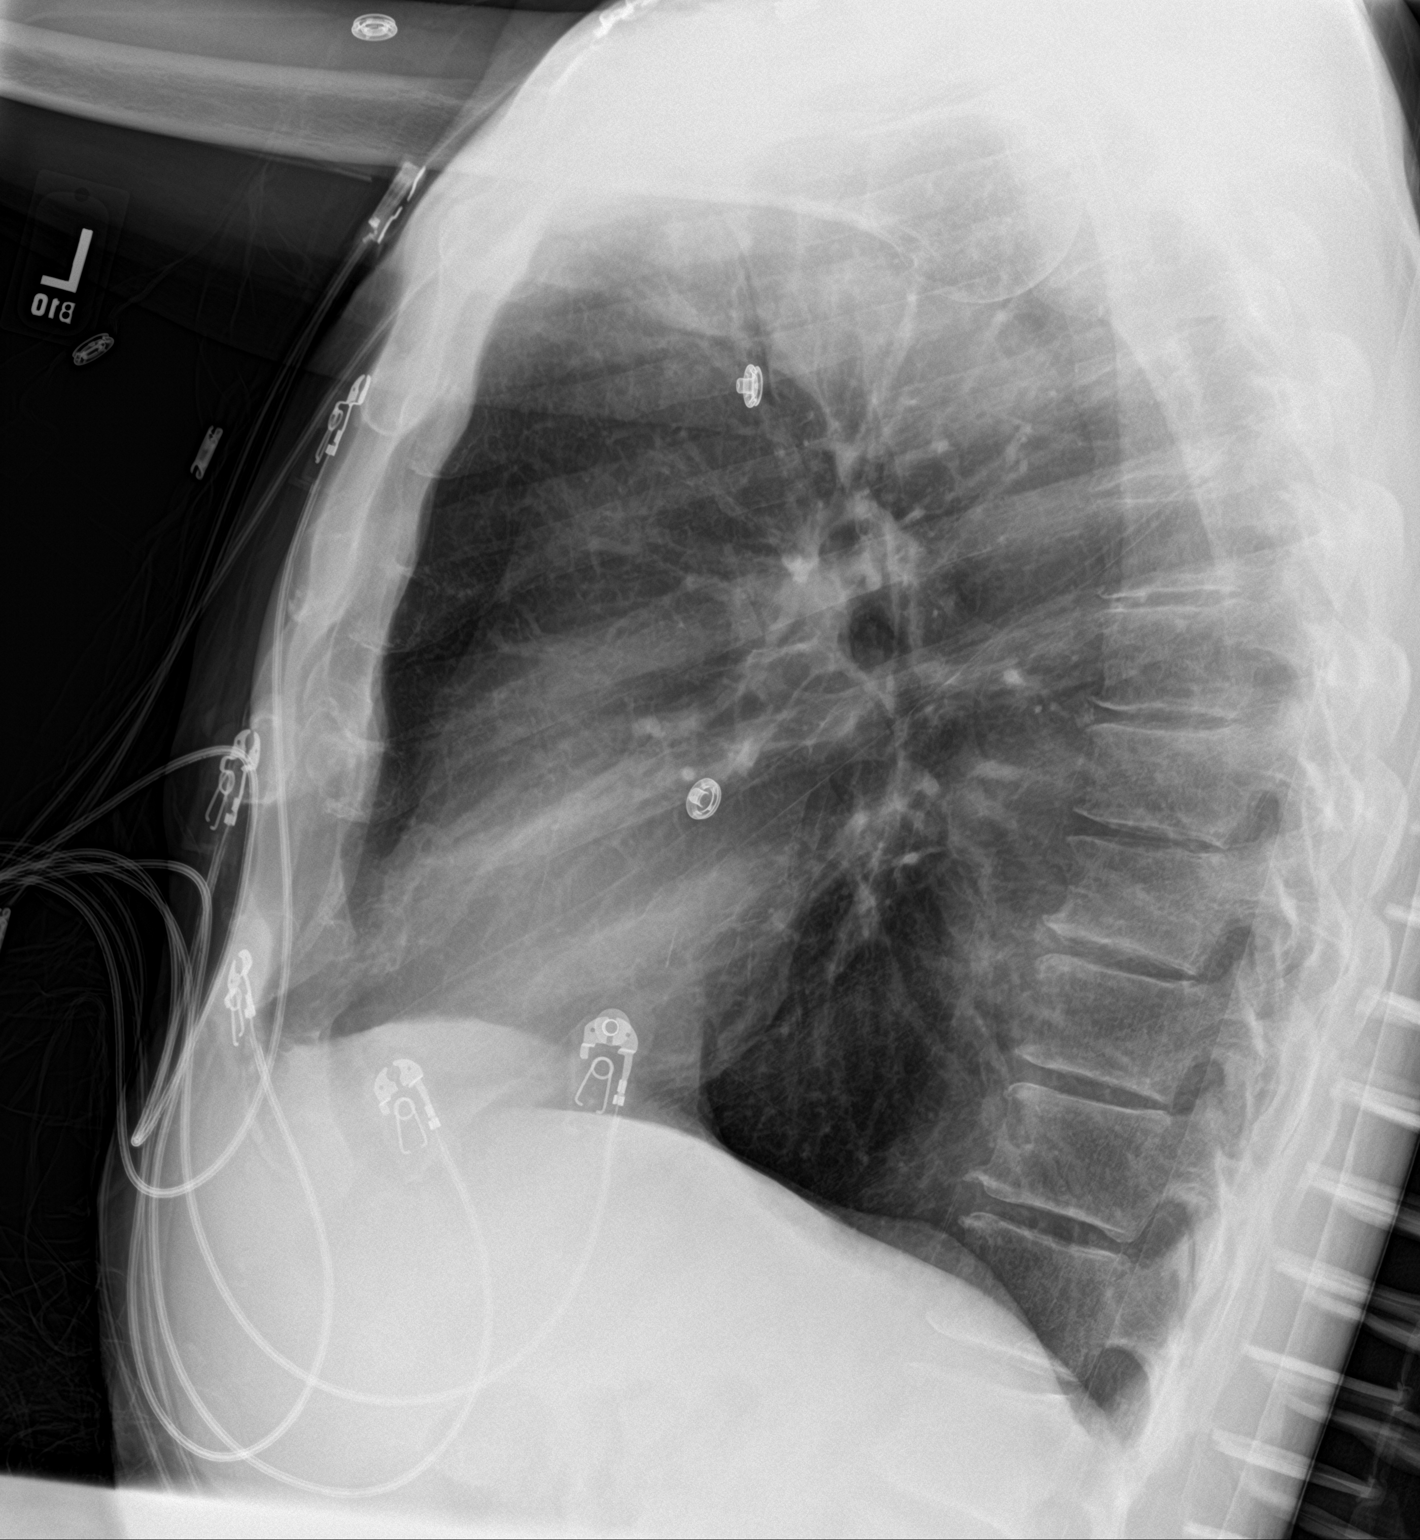

[chest ap]
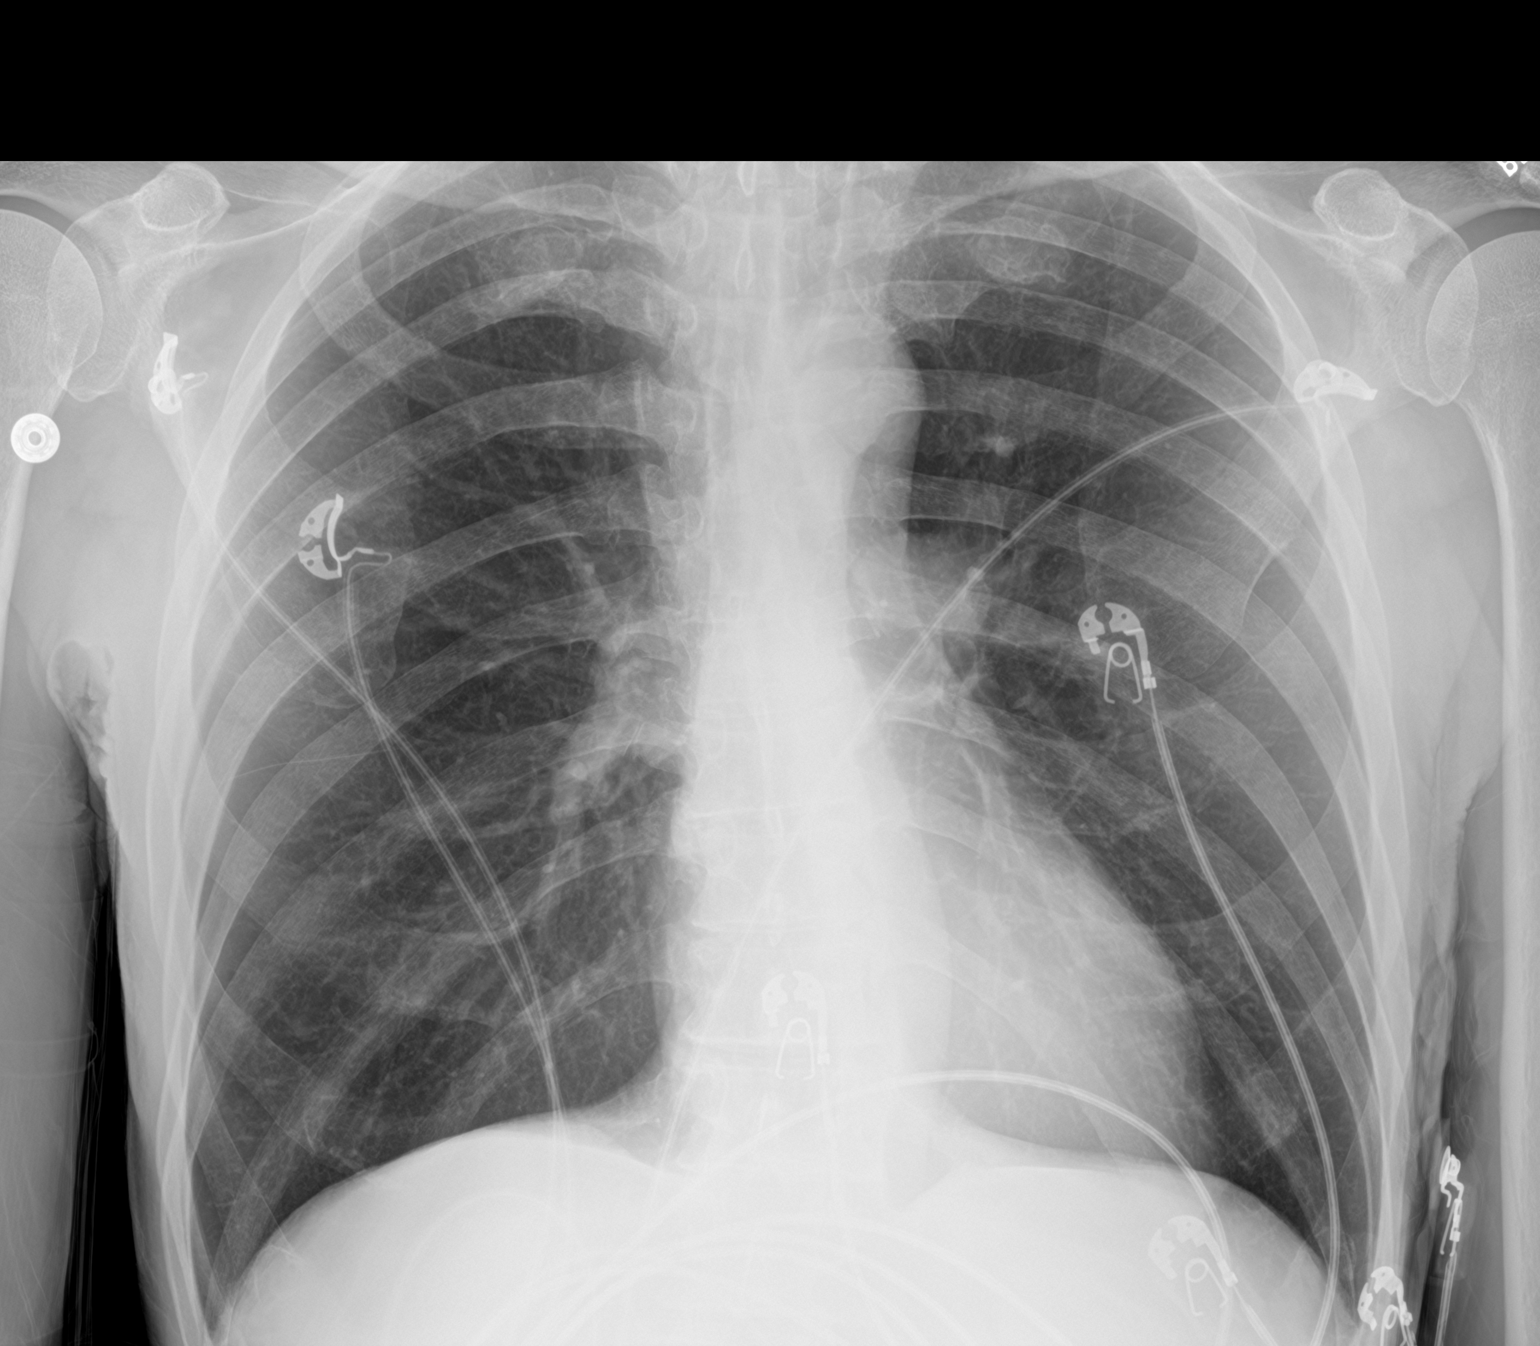

[2 of 2 positions shown; findings below may reference images not displayed]

FINDINGS: The heart size and mediastinal contours are within normal limits.
Both lungs are clear. No pneumothorax or pleural effusion is noted.
The visualized skeletal structures are unremarkable.
IMPRESSION: No active cardiopulmonary disease.

## 2019-08-20 DIAGNOSIS — M5136 Other intervertebral disc degeneration, lumbar region: Secondary | ICD-10-CM | POA: Diagnosis not present

## 2019-08-20 DIAGNOSIS — Z79899 Other long term (current) drug therapy: Secondary | ICD-10-CM | POA: Diagnosis not present

## 2019-08-20 DIAGNOSIS — G894 Chronic pain syndrome: Secondary | ICD-10-CM | POA: Diagnosis not present

## 2019-08-21 DIAGNOSIS — M25562 Pain in left knee: Secondary | ICD-10-CM | POA: Diagnosis not present

## 2019-09-17 DIAGNOSIS — Z79899 Other long term (current) drug therapy: Secondary | ICD-10-CM | POA: Diagnosis not present

## 2019-09-17 DIAGNOSIS — M1710 Unilateral primary osteoarthritis, unspecified knee: Secondary | ICD-10-CM | POA: Diagnosis not present

## 2019-09-17 DIAGNOSIS — G8929 Other chronic pain: Secondary | ICD-10-CM | POA: Diagnosis not present

## 2019-09-17 DIAGNOSIS — G894 Chronic pain syndrome: Secondary | ICD-10-CM | POA: Diagnosis not present

## 2019-09-17 DIAGNOSIS — M545 Low back pain: Secondary | ICD-10-CM | POA: Diagnosis not present

## 2019-10-14 ENCOUNTER — Telehealth: Payer: Self-pay | Admitting: Family Medicine

## 2019-10-14 DIAGNOSIS — G894 Chronic pain syndrome: Secondary | ICD-10-CM | POA: Diagnosis not present

## 2019-10-14 DIAGNOSIS — F17210 Nicotine dependence, cigarettes, uncomplicated: Secondary | ICD-10-CM | POA: Diagnosis not present

## 2019-10-14 DIAGNOSIS — Z79899 Other long term (current) drug therapy: Secondary | ICD-10-CM | POA: Diagnosis not present

## 2019-10-14 DIAGNOSIS — I10 Essential (primary) hypertension: Secondary | ICD-10-CM

## 2019-10-14 DIAGNOSIS — M5136 Other intervertebral disc degeneration, lumbar region: Secondary | ICD-10-CM | POA: Diagnosis not present

## 2019-10-14 DIAGNOSIS — M1710 Unilateral primary osteoarthritis, unspecified knee: Secondary | ICD-10-CM | POA: Diagnosis not present

## 2019-10-15 NOTE — Telephone Encounter (Signed)
Attempted to call patient for appointment.  There was no answer or available voicemail.  Will try later.  Brian Morgan, [place]

## 2019-10-15 NOTE — Telephone Encounter (Signed)
Refills provided. Please have patient schedule a annual physical at earliest convenience as it has been a year since he was seen. Thank you.

## 2019-10-16 NOTE — Telephone Encounter (Signed)
Attempted to call patient to schedule an appointment.  There was no answer and no availability to leave a voicemail message.  Brian Morgan, [place]

## 2019-10-19 NOTE — Telephone Encounter (Signed)
Attempted to call patient for third time.  No answer and no voice mail available.  Called spouse's number in file and person answering phone denied knowing patient.  Ozella Almond, [place]

## 2019-11-07 ENCOUNTER — Other Ambulatory Visit: Payer: Self-pay

## 2019-11-07 ENCOUNTER — Ambulatory Visit (HOSPITAL_COMMUNITY)
Admission: EM | Admit: 2019-11-07 | Discharge: 2019-11-07 | Disposition: A | Discharge status: Home / Self Care | Payer: Medicare Other | Attending: Family Medicine | Admitting: Family Medicine

## 2019-11-07 ENCOUNTER — Encounter (HOSPITAL_COMMUNITY): Payer: Self-pay

## 2019-11-07 DIAGNOSIS — M545 Low back pain, unspecified: Secondary | ICD-10-CM

## 2019-11-07 MED ORDER — KETOROLAC TROMETHAMINE 30 MG/ML IJ SOLN
INTRAMUSCULAR | Status: AC
  Filled 2019-11-07: qty 1

## 2019-11-07 MED ORDER — TRAMADOL HCL 50 MG PO TABS: 50.0000 mg | ORAL_TABLET | Freq: Four times a day (QID) | ORAL | 0 refills | Status: AC | PRN

## 2019-11-07 MED ORDER — METHYLPREDNISOLONE SODIUM SUCC 125 MG IJ SOLR
60.0000 mg | Freq: Once | INTRAMUSCULAR | Status: AC
  Administered 2019-11-07: 16:00:00 60 mg via INTRAMUSCULAR

## 2019-11-07 MED ORDER — KETOROLAC TROMETHAMINE 30 MG/ML IJ SOLN
30.0000 mg | Freq: Once | INTRAMUSCULAR | Status: AC
  Administered 2019-11-07: 30 mg via INTRAMUSCULAR

## 2019-11-07 MED ORDER — CYCLOBENZAPRINE HCL 10 MG PO TABS: 10.0000 mg | ORAL_TABLET | Freq: Two times a day (BID) | ORAL | 0 refills | Status: AC | PRN

## 2019-11-07 MED ORDER — METHYLPREDNISOLONE SODIUM SUCC 125 MG IJ SOLR
INTRAMUSCULAR | Status: AC
  Filled 2019-11-07: qty 2

## 2019-11-07 NOTE — ED Triage Notes (Signed)
C/o acute on chronic lower back pain with numbness in his left leg below the knee starting yesterday. Hx two degenerative disc and one slipped disc in his lower back.
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
## 2019-11-07 NOTE — Discharge Instructions (Addendum)
Take the prescribed tramadol for your pain.  Take the muscle relaxer Flexeril as needed for muscle spasm; do not drive, operate machinery, or drink alcohol with this medication as it may make you drowsy.    Follow up with an orthopedist if your pain is not improving.  Go to the emergency department if you have worsening pain or develop new symptoms such as difficulty with urination, weakness, numbness, loss of control of your bladder or bowels, fever, chills or other concerns.

## 2019-11-11 ENCOUNTER — Other Ambulatory Visit: Payer: Self-pay

## 2019-11-11 ENCOUNTER — Emergency Department (HOSPITAL_COMMUNITY)
Admission: EM | Admit: 2019-11-11 | Discharge: 2019-11-11 | Discharge status: Against Medical Advice | Payer: Medicare Other | Attending: Emergency Medicine | Admitting: Emergency Medicine

## 2019-11-11 ENCOUNTER — Encounter (HOSPITAL_COMMUNITY): Payer: Self-pay | Admitting: Emergency Medicine

## 2019-11-11 ENCOUNTER — Emergency Department (HOSPITAL_COMMUNITY): Payer: Medicare Other

## 2019-11-11 DIAGNOSIS — Z79899 Other long term (current) drug therapy: Secondary | ICD-10-CM | POA: Insufficient documentation

## 2019-11-11 DIAGNOSIS — M5136 Other intervertebral disc degeneration, lumbar region: Secondary | ICD-10-CM | POA: Diagnosis not present

## 2019-11-11 DIAGNOSIS — I10 Essential (primary) hypertension: Secondary | ICD-10-CM | POA: Diagnosis not present

## 2019-11-11 DIAGNOSIS — R519 Headache, unspecified: Secondary | ICD-10-CM | POA: Insufficient documentation

## 2019-11-11 DIAGNOSIS — M549 Dorsalgia, unspecified: Secondary | ICD-10-CM

## 2019-11-11 DIAGNOSIS — M48061 Spinal stenosis, lumbar region without neurogenic claudication: Secondary | ICD-10-CM | POA: Diagnosis not present

## 2019-11-11 DIAGNOSIS — F17210 Nicotine dependence, cigarettes, uncomplicated: Secondary | ICD-10-CM | POA: Insufficient documentation

## 2019-11-11 DIAGNOSIS — Z5320 Procedure and treatment not carried out because of patient's decision for unspecified reasons: Secondary | ICD-10-CM | POA: Diagnosis not present

## 2019-11-11 DIAGNOSIS — M545 Low back pain: Secondary | ICD-10-CM | POA: Insufficient documentation

## 2019-11-11 DIAGNOSIS — J449 Chronic obstructive pulmonary disease, unspecified: Secondary | ICD-10-CM | POA: Insufficient documentation

## 2019-11-11 LAB — BASIC METABOLIC PANEL WITH GFR
Anion gap: 10 (ref 5–15)
BUN: 17 mg/dL (ref 8–23)
CO2: 27 mmol/L (ref 22–32)
Calcium: 8.9 mg/dL (ref 8.9–10.3)
Chloride: 98 mmol/L (ref 98–111)
Creatinine, Ser: 0.92 mg/dL (ref 0.61–1.24)
GFR calc Af Amer: >60 mL/min
GFR calc non Af Amer: >60 mL/min
Glucose, Bld: 115 mg/dL — ABNORMAL HIGH (ref 70–99)
Potassium: 3.9 mmol/L (ref 3.5–5.1)
Sodium: 135 mmol/L (ref 135–145)

## 2019-11-11 LAB — URINALYSIS, ROUTINE W REFLEX MICROSCOPIC
Bilirubin Urine: NEGATIVE
Glucose, UA: NEGATIVE mg/dL
Hgb urine dipstick: NEGATIVE
Ketones, ur: NEGATIVE mg/dL
Leukocytes,Ua: NEGATIVE
Nitrite: NEGATIVE
Protein, ur: 30 mg/dL — AB
Specific Gravity, Urine: 1.03 (ref 1.005–1.030)
pH: 5 (ref 5.0–8.0)

## 2019-11-11 LAB — SEDIMENTATION RATE: Sed Rate: 40 mm/hr — ABNORMAL HIGH (ref 0–16)

## 2019-11-11 LAB — CBC WITH DIFFERENTIAL/PLATELET
Abs Immature Granulocytes: 0.05 K/uL (ref 0.00–0.07)
Basophils Absolute: 0 K/uL (ref 0.0–0.1)
Basophils Relative: 0 %
Eosinophils Absolute: 0.1 K/uL (ref 0.0–0.5)
Eosinophils Relative: 1 %
HCT: 45.5 % (ref 39.0–52.0)
Hemoglobin: 14.8 g/dL (ref 13.0–17.0)
Immature Granulocytes: 1 %
Lymphocytes Relative: 8 %
Lymphs Abs: 0.9 K/uL (ref 0.7–4.0)
MCH: 32.7 pg (ref 26.0–34.0)
MCHC: 32.5 g/dL (ref 30.0–36.0)
MCV: 100.7 fL — ABNORMAL HIGH (ref 80.0–100.0)
Monocytes Absolute: 0.9 K/uL (ref 0.1–1.0)
Monocytes Relative: 9 %
Neutro Abs: 8.6 K/uL — ABNORMAL HIGH (ref 1.7–7.7)
Neutrophils Relative %: 81 %
Platelets: 239 K/uL (ref 150–400)
RBC: 4.52 MIL/uL (ref 4.22–5.81)
RDW: 12.1 % (ref 11.5–15.5)
WBC: 10.5 K/uL (ref 4.0–10.5)
nRBC: 0 % (ref 0.0–0.2)

## 2019-11-11 LAB — C-REACTIVE PROTEIN: CRP: 11.6 mg/dL — ABNORMAL HIGH (ref <1.0)

## 2019-11-11 MED ORDER — ONDANSETRON HCL 4 MG/2ML IJ SOLN
4.0000 mg | Freq: Once | INTRAMUSCULAR | Status: AC
  Administered 2019-11-11: 4 mg via INTRAVENOUS
  Filled 2019-11-11: qty 2

## 2019-11-11 MED ORDER — OXYCODONE-ACETAMINOPHEN 5-325 MG PO TABS
1.0000 | ORAL_TABLET | ORAL | Status: AC | PRN
  Administered 2019-11-11 (×2): 1 via ORAL
  Filled 2019-11-11 (×2): qty 1

## 2019-11-11 MED ORDER — MORPHINE SULFATE (PF) 4 MG/ML IV SOLN
4.0000 mg | Freq: Once | INTRAVENOUS | Status: AC
  Administered 2019-11-11: 13:00:00 4 mg via INTRAVENOUS
  Filled 2019-11-11: qty 1

## 2019-11-11 MED ORDER — GADOBUTROL 1 MMOL/ML IV SOLN
6.0000 mL | Freq: Once | INTRAVENOUS | Status: AC | PRN
  Administered 2019-11-11: 6 mL via INTRAVENOUS

## 2019-11-11 NOTE — ED Provider Notes (Signed)
Pt ended up eloping before reviewing his results.  MRI concerning for the possibility of discitis.   Will need to try and contact pt to inform him of abnormal results and to return to the ED   Dorie Rank, MD 11/11/19 1755

## 2019-11-11 NOTE — ED Notes (Signed)
Pt eloped w/o informing nursing staff. Pt did have IV in his arm, multiple attempts to contact patient met with busy tone or incorrect phone number for spouse.

## 2019-11-11 NOTE — ED Notes (Signed)
This RN went to pt's bed to update VS, pt found to be absent from bed. Checked bathrooms, pt not found. Believe to have left AMA.

## 2019-11-11 NOTE — ED Notes (Signed)
Personally called this patient 3 times gives busy signal each time. Pt had a significant other phone number that was incorrect. MD aware unable to contact this patient after he left without informing staff.
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
## 2019-11-11 NOTE — ED Triage Notes (Signed)
Pt endorses chronic lower back pain. Given steroids and pain meds at [facility] on 4/3. Hx of slipped disc and degenerative disc. No relief with prescribed meds and unable to sleep due to pain.

## 2019-11-12 LAB — URINE CULTURE: Culture: <10000 — AB

## 2019-11-16 DIAGNOSIS — M545 Low back pain: Secondary | ICD-10-CM | POA: Diagnosis not present

## 2019-11-16 DIAGNOSIS — Z79899 Other long term (current) drug therapy: Secondary | ICD-10-CM | POA: Diagnosis not present

## 2019-11-16 DIAGNOSIS — G894 Chronic pain syndrome: Secondary | ICD-10-CM | POA: Diagnosis not present

## 2019-11-16 DIAGNOSIS — G8929 Other chronic pain: Secondary | ICD-10-CM | POA: Diagnosis not present

## 2019-11-26 DIAGNOSIS — M5126 Other intervertebral disc displacement, lumbar region: Secondary | ICD-10-CM | POA: Diagnosis not present

## 2019-11-26 DIAGNOSIS — G8929 Other chronic pain: Secondary | ICD-10-CM | POA: Diagnosis not present

## 2019-11-26 DIAGNOSIS — Z9289 Personal history of other medical treatment: Secondary | ICD-10-CM | POA: Diagnosis not present

## 2019-11-26 DIAGNOSIS — I10 Essential (primary) hypertension: Secondary | ICD-10-CM | POA: Diagnosis not present

## 2019-11-26 DIAGNOSIS — M4620 Osteomyelitis of vertebra, site unspecified: Secondary | ICD-10-CM | POA: Diagnosis not present

## 2019-11-26 DIAGNOSIS — Z743 Need for continuous supervision: Secondary | ICD-10-CM | POA: Diagnosis not present

## 2019-11-26 DIAGNOSIS — M4626 Osteomyelitis of vertebra, lumbar region: Secondary | ICD-10-CM | POA: Diagnosis not present

## 2019-11-26 DIAGNOSIS — M5489 Other dorsalgia: Secondary | ICD-10-CM | POA: Diagnosis not present

## 2019-11-26 DIAGNOSIS — J449 Chronic obstructive pulmonary disease, unspecified: Secondary | ICD-10-CM | POA: Diagnosis not present

## 2019-11-26 DIAGNOSIS — B3789 Other sites of candidiasis: Secondary | ICD-10-CM | POA: Diagnosis not present

## 2019-11-26 DIAGNOSIS — M545 Low back pain: Secondary | ICD-10-CM | POA: Diagnosis not present

## 2019-11-26 DIAGNOSIS — M48061 Spinal stenosis, lumbar region without neurogenic claudication: Secondary | ICD-10-CM | POA: Diagnosis not present

## 2019-11-26 DIAGNOSIS — M4646 Discitis, unspecified, lumbar region: Secondary | ICD-10-CM | POA: Diagnosis not present

## 2019-11-26 DIAGNOSIS — N1330 Unspecified hydronephrosis: Secondary | ICD-10-CM | POA: Diagnosis not present

## 2019-12-02 DIAGNOSIS — M4620 Osteomyelitis of vertebra, site unspecified: Secondary | ICD-10-CM | POA: Diagnosis not present

## 2019-12-04 DIAGNOSIS — H2513 Age-related nuclear cataract, bilateral: Secondary | ICD-10-CM | POA: Diagnosis not present

## 2019-12-04 DIAGNOSIS — H35433 Paving stone degeneration of retina, bilateral: Secondary | ICD-10-CM | POA: Diagnosis not present

## 2019-12-04 DIAGNOSIS — H5213 Myopia, bilateral: Secondary | ICD-10-CM | POA: Diagnosis not present

## 2019-12-04 DIAGNOSIS — H35362 Drusen (degenerative) of macula, left eye: Secondary | ICD-10-CM | POA: Diagnosis not present

## 2019-12-04 DIAGNOSIS — H52223 Regular astigmatism, bilateral: Secondary | ICD-10-CM | POA: Diagnosis not present

## 2019-12-16 DIAGNOSIS — Z79899 Other long term (current) drug therapy: Secondary | ICD-10-CM | POA: Diagnosis not present

## 2019-12-16 DIAGNOSIS — G894 Chronic pain syndrome: Secondary | ICD-10-CM | POA: Diagnosis not present

## 2019-12-16 DIAGNOSIS — M5136 Other intervertebral disc degeneration, lumbar region: Secondary | ICD-10-CM | POA: Diagnosis not present

## 2019-12-16 DIAGNOSIS — E559 Vitamin D deficiency, unspecified: Secondary | ICD-10-CM | POA: Diagnosis not present

## 2020-01-01 DIAGNOSIS — I444 Left anterior fascicular block: Secondary | ICD-10-CM | POA: Diagnosis not present

## 2020-01-01 DIAGNOSIS — Z743 Need for continuous supervision: Secondary | ICD-10-CM | POA: Diagnosis not present

## 2020-01-01 DIAGNOSIS — R3915 Urgency of urination: Secondary | ICD-10-CM | POA: Diagnosis not present

## 2020-01-01 DIAGNOSIS — M549 Dorsalgia, unspecified: Secondary | ICD-10-CM | POA: Diagnosis not present

## 2020-01-01 DIAGNOSIS — K5900 Constipation, unspecified: Secondary | ICD-10-CM | POA: Diagnosis not present

## 2020-01-01 DIAGNOSIS — G8929 Other chronic pain: Secondary | ICD-10-CM | POA: Diagnosis not present

## 2020-01-01 DIAGNOSIS — E43 Unspecified severe protein-calorie malnutrition: Secondary | ICD-10-CM | POA: Diagnosis not present

## 2020-01-01 DIAGNOSIS — E871 Hypo-osmolality and hyponatremia: Secondary | ICD-10-CM | POA: Diagnosis not present

## 2020-01-01 DIAGNOSIS — M5489 Other dorsalgia: Secondary | ICD-10-CM | POA: Diagnosis not present

## 2020-01-01 DIAGNOSIS — I10 Essential (primary) hypertension: Secondary | ICD-10-CM | POA: Diagnosis not present

## 2020-01-01 DIAGNOSIS — M4626 Osteomyelitis of vertebra, lumbar region: Secondary | ICD-10-CM | POA: Diagnosis not present

## 2020-01-01 DIAGNOSIS — M4646 Discitis, unspecified, lumbar region: Secondary | ICD-10-CM | POA: Diagnosis not present

## 2020-01-01 DIAGNOSIS — J449 Chronic obstructive pulmonary disease, unspecified: Secondary | ICD-10-CM | POA: Diagnosis not present

## 2020-01-01 DIAGNOSIS — R350 Frequency of micturition: Secondary | ICD-10-CM | POA: Diagnosis not present

## 2020-01-01 DIAGNOSIS — B3789 Other sites of candidiasis: Secondary | ICD-10-CM | POA: Diagnosis not present

## 2020-01-01 DIAGNOSIS — M48061 Spinal stenosis, lumbar region without neurogenic claudication: Secondary | ICD-10-CM | POA: Diagnosis not present

## 2020-01-01 DIAGNOSIS — M545 Low back pain: Secondary | ICD-10-CM | POA: Diagnosis not present

## 2020-01-15 DIAGNOSIS — G8929 Other chronic pain: Secondary | ICD-10-CM | POA: Diagnosis not present

## 2020-01-15 DIAGNOSIS — F17210 Nicotine dependence, cigarettes, uncomplicated: Secondary | ICD-10-CM | POA: Diagnosis not present

## 2020-01-15 DIAGNOSIS — G894 Chronic pain syndrome: Secondary | ICD-10-CM | POA: Diagnosis not present

## 2020-01-15 DIAGNOSIS — Z79899 Other long term (current) drug therapy: Secondary | ICD-10-CM | POA: Diagnosis not present

## 2020-01-15 DIAGNOSIS — M545 Low back pain: Secondary | ICD-10-CM | POA: Diagnosis not present

## 2020-02-17 DIAGNOSIS — G8929 Other chronic pain: Secondary | ICD-10-CM | POA: Diagnosis not present

## 2020-02-17 DIAGNOSIS — G894 Chronic pain syndrome: Secondary | ICD-10-CM | POA: Diagnosis not present

## 2020-02-17 DIAGNOSIS — Z79899 Other long term (current) drug therapy: Secondary | ICD-10-CM | POA: Diagnosis not present

## 2020-02-17 DIAGNOSIS — M545 Low back pain: Secondary | ICD-10-CM | POA: Diagnosis not present

## 2020-02-24 DIAGNOSIS — M545 Low back pain: Secondary | ICD-10-CM | POA: Diagnosis not present

## 2020-02-29 DIAGNOSIS — E440 Moderate protein-calorie malnutrition: Secondary | ICD-10-CM | POA: Diagnosis not present

## 2020-02-29 DIAGNOSIS — Z9114 Patient's other noncompliance with medication regimen: Secondary | ICD-10-CM | POA: Diagnosis not present

## 2020-02-29 DIAGNOSIS — R197 Diarrhea, unspecified: Secondary | ICD-10-CM | POA: Diagnosis not present

## 2020-02-29 DIAGNOSIS — R0689 Other abnormalities of breathing: Secondary | ICD-10-CM | POA: Diagnosis not present

## 2020-02-29 DIAGNOSIS — M4626 Osteomyelitis of vertebra, lumbar region: Secondary | ICD-10-CM | POA: Diagnosis not present

## 2020-02-29 DIAGNOSIS — R509 Fever, unspecified: Secondary | ICD-10-CM | POA: Diagnosis not present

## 2020-02-29 DIAGNOSIS — Z743 Need for continuous supervision: Secondary | ICD-10-CM | POA: Diagnosis not present

## 2020-02-29 DIAGNOSIS — R9431 Abnormal electrocardiogram [ECG] [EKG]: Secondary | ICD-10-CM | POA: Diagnosis not present

## 2020-02-29 DIAGNOSIS — R2681 Unsteadiness on feet: Secondary | ICD-10-CM | POA: Diagnosis not present

## 2020-02-29 DIAGNOSIS — M6289 Other specified disorders of muscle: Secondary | ICD-10-CM | POA: Diagnosis not present

## 2020-02-29 DIAGNOSIS — Z20822 Contact with and (suspected) exposure to covid-19: Secondary | ICD-10-CM | POA: Diagnosis not present

## 2020-02-29 DIAGNOSIS — I10 Essential (primary) hypertension: Secondary | ICD-10-CM | POA: Diagnosis not present

## 2020-02-29 DIAGNOSIS — I700 Atherosclerosis of aorta: Secondary | ICD-10-CM | POA: Diagnosis not present

## 2020-02-29 DIAGNOSIS — M5489 Other dorsalgia: Secondary | ICD-10-CM | POA: Diagnosis not present

## 2020-02-29 DIAGNOSIS — G8929 Other chronic pain: Secondary | ICD-10-CM | POA: Diagnosis not present

## 2020-02-29 DIAGNOSIS — I252 Old myocardial infarction: Secondary | ICD-10-CM | POA: Diagnosis not present

## 2020-02-29 DIAGNOSIS — S3992XA Unspecified injury of lower back, initial encounter: Secondary | ICD-10-CM | POA: Diagnosis not present

## 2020-02-29 DIAGNOSIS — B3789 Other sites of candidiasis: Secondary | ICD-10-CM | POA: Diagnosis not present

## 2020-02-29 DIAGNOSIS — J439 Emphysema, unspecified: Secondary | ICD-10-CM | POA: Diagnosis not present

## 2020-02-29 DIAGNOSIS — M545 Low back pain: Secondary | ICD-10-CM | POA: Diagnosis not present

## 2020-02-29 DIAGNOSIS — M4646 Discitis, unspecified, lumbar region: Secondary | ICD-10-CM | POA: Diagnosis not present

## 2020-02-29 DIAGNOSIS — K5730 Diverticulosis of large intestine without perforation or abscess without bleeding: Secondary | ICD-10-CM | POA: Diagnosis not present

## 2020-02-29 DIAGNOSIS — R112 Nausea with vomiting, unspecified: Secondary | ICD-10-CM | POA: Diagnosis not present

## 2020-03-01 DIAGNOSIS — R509 Fever, unspecified: Secondary | ICD-10-CM | POA: Diagnosis not present

## 2020-03-01 DIAGNOSIS — E440 Moderate protein-calorie malnutrition: Secondary | ICD-10-CM | POA: Diagnosis not present

## 2020-03-01 DIAGNOSIS — J432 Centrilobular emphysema: Secondary | ICD-10-CM | POA: Diagnosis not present

## 2020-03-01 DIAGNOSIS — M4626 Osteomyelitis of vertebra, lumbar region: Secondary | ICD-10-CM | POA: Diagnosis not present

## 2020-03-01 DIAGNOSIS — B3789 Other sites of candidiasis: Secondary | ICD-10-CM | POA: Diagnosis not present

## 2020-03-01 DIAGNOSIS — I2510 Atherosclerotic heart disease of native coronary artery without angina pectoris: Secondary | ICD-10-CM | POA: Diagnosis not present

## 2020-03-01 DIAGNOSIS — M545 Low back pain: Secondary | ICD-10-CM | POA: Diagnosis not present

## 2020-03-01 DIAGNOSIS — I10 Essential (primary) hypertension: Secondary | ICD-10-CM | POA: Diagnosis not present

## 2020-03-01 DIAGNOSIS — M4646 Discitis, unspecified, lumbar region: Secondary | ICD-10-CM | POA: Diagnosis not present

## 2020-03-01 DIAGNOSIS — K5730 Diverticulosis of large intestine without perforation or abscess without bleeding: Secondary | ICD-10-CM | POA: Diagnosis not present

## 2020-03-01 DIAGNOSIS — I700 Atherosclerosis of aorta: Secondary | ICD-10-CM | POA: Diagnosis not present

## 2020-03-01 DIAGNOSIS — M6289 Other specified disorders of muscle: Secondary | ICD-10-CM | POA: Diagnosis not present

## 2020-03-02 DIAGNOSIS — M4646 Discitis, unspecified, lumbar region: Secondary | ICD-10-CM | POA: Diagnosis not present

## 2020-03-02 DIAGNOSIS — B3789 Other sites of candidiasis: Secondary | ICD-10-CM | POA: Diagnosis not present

## 2020-03-02 DIAGNOSIS — M545 Low back pain: Secondary | ICD-10-CM | POA: Diagnosis not present

## 2020-03-02 DIAGNOSIS — R509 Fever, unspecified: Secondary | ICD-10-CM | POA: Diagnosis not present

## 2020-03-02 DIAGNOSIS — M4626 Osteomyelitis of vertebra, lumbar region: Secondary | ICD-10-CM | POA: Diagnosis not present

## 2020-03-17 DIAGNOSIS — M545 Low back pain: Secondary | ICD-10-CM | POA: Diagnosis not present

## 2020-03-17 DIAGNOSIS — G8929 Other chronic pain: Secondary | ICD-10-CM | POA: Diagnosis not present

## 2020-03-17 DIAGNOSIS — G894 Chronic pain syndrome: Secondary | ICD-10-CM | POA: Diagnosis not present

## 2020-03-17 DIAGNOSIS — Z79899 Other long term (current) drug therapy: Secondary | ICD-10-CM | POA: Diagnosis not present

## 2020-04-18 DIAGNOSIS — M545 Low back pain: Secondary | ICD-10-CM | POA: Diagnosis not present

## 2020-04-18 DIAGNOSIS — G8929 Other chronic pain: Secondary | ICD-10-CM | POA: Diagnosis not present

## 2020-04-18 DIAGNOSIS — Z79899 Other long term (current) drug therapy: Secondary | ICD-10-CM | POA: Diagnosis not present

## 2020-04-18 DIAGNOSIS — G894 Chronic pain syndrome: Secondary | ICD-10-CM | POA: Diagnosis not present

## 2020-04-27 DIAGNOSIS — M549 Dorsalgia, unspecified: Secondary | ICD-10-CM | POA: Diagnosis not present

## 2020-04-27 DIAGNOSIS — B3789 Other sites of candidiasis: Secondary | ICD-10-CM | POA: Diagnosis not present

## 2020-04-27 DIAGNOSIS — F17200 Nicotine dependence, unspecified, uncomplicated: Secondary | ICD-10-CM | POA: Diagnosis not present

## 2020-04-27 DIAGNOSIS — M4620 Osteomyelitis of vertebra, site unspecified: Secondary | ICD-10-CM | POA: Diagnosis not present

## 2020-04-27 DIAGNOSIS — M4626 Osteomyelitis of vertebra, lumbar region: Secondary | ICD-10-CM | POA: Diagnosis not present

## 2020-05-17 DIAGNOSIS — Z79899 Other long term (current) drug therapy: Secondary | ICD-10-CM | POA: Diagnosis not present

## 2020-05-17 DIAGNOSIS — M545 Low back pain, unspecified: Secondary | ICD-10-CM | POA: Diagnosis not present

## 2020-05-17 DIAGNOSIS — G894 Chronic pain syndrome: Secondary | ICD-10-CM | POA: Diagnosis not present

## 2020-05-17 DIAGNOSIS — G8929 Other chronic pain: Secondary | ICD-10-CM | POA: Diagnosis not present

## 2020-06-16 DIAGNOSIS — H905 Unspecified sensorineural hearing loss: Secondary | ICD-10-CM | POA: Diagnosis not present

## 2020-06-17 DIAGNOSIS — G8929 Other chronic pain: Secondary | ICD-10-CM | POA: Diagnosis not present

## 2020-06-17 DIAGNOSIS — G894 Chronic pain syndrome: Secondary | ICD-10-CM | POA: Diagnosis not present

## 2020-06-17 DIAGNOSIS — M545 Low back pain, unspecified: Secondary | ICD-10-CM | POA: Diagnosis not present

## 2020-06-17 DIAGNOSIS — Z79899 Other long term (current) drug therapy: Secondary | ICD-10-CM | POA: Diagnosis not present

## 2020-07-19 DIAGNOSIS — G8929 Other chronic pain: Secondary | ICD-10-CM | POA: Diagnosis not present

## 2020-07-19 DIAGNOSIS — E559 Vitamin D deficiency, unspecified: Secondary | ICD-10-CM | POA: Diagnosis not present

## 2020-07-19 DIAGNOSIS — M545 Low back pain, unspecified: Secondary | ICD-10-CM | POA: Diagnosis not present

## 2020-07-19 DIAGNOSIS — G894 Chronic pain syndrome: Secondary | ICD-10-CM | POA: Diagnosis not present

## 2020-07-19 DIAGNOSIS — Z79899 Other long term (current) drug therapy: Secondary | ICD-10-CM | POA: Diagnosis not present

## 2020-08-19 DIAGNOSIS — M545 Low back pain, unspecified: Secondary | ICD-10-CM | POA: Diagnosis not present

## 2020-08-19 DIAGNOSIS — G894 Chronic pain syndrome: Secondary | ICD-10-CM | POA: Diagnosis not present

## 2020-08-19 DIAGNOSIS — G8929 Other chronic pain: Secondary | ICD-10-CM | POA: Diagnosis not present

## 2020-08-19 DIAGNOSIS — Z79899 Other long term (current) drug therapy: Secondary | ICD-10-CM | POA: Diagnosis not present

## 2020-09-13 DIAGNOSIS — Z9114 Patient's other noncompliance with medication regimen: Secondary | ICD-10-CM | POA: Diagnosis not present

## 2020-09-13 DIAGNOSIS — J449 Chronic obstructive pulmonary disease, unspecified: Secondary | ICD-10-CM | POA: Diagnosis not present

## 2020-09-13 DIAGNOSIS — M25462 Effusion, left knee: Secondary | ICD-10-CM | POA: Diagnosis not present

## 2020-09-13 DIAGNOSIS — M47812 Spondylosis without myelopathy or radiculopathy, cervical region: Secondary | ICD-10-CM | POA: Diagnosis not present

## 2020-09-13 DIAGNOSIS — R262 Difficulty in walking, not elsewhere classified: Secondary | ICD-10-CM | POA: Diagnosis not present

## 2020-09-13 DIAGNOSIS — R52 Pain, unspecified: Secondary | ICD-10-CM | POA: Diagnosis not present

## 2020-09-13 DIAGNOSIS — M25461 Effusion, right knee: Secondary | ICD-10-CM | POA: Diagnosis not present

## 2020-09-13 DIAGNOSIS — J0120 Acute ethmoidal sinusitis, unspecified: Secondary | ICD-10-CM | POA: Diagnosis not present

## 2020-09-13 DIAGNOSIS — G894 Chronic pain syndrome: Secondary | ICD-10-CM | POA: Diagnosis not present

## 2020-09-13 DIAGNOSIS — I6782 Cerebral ischemia: Secondary | ICD-10-CM | POA: Diagnosis not present

## 2020-09-13 DIAGNOSIS — E871 Hypo-osmolality and hyponatremia: Secondary | ICD-10-CM | POA: Diagnosis not present

## 2020-09-13 DIAGNOSIS — K5730 Diverticulosis of large intestine without perforation or abscess without bleeding: Secondary | ICD-10-CM | POA: Diagnosis not present

## 2020-09-13 DIAGNOSIS — F17210 Nicotine dependence, cigarettes, uncomplicated: Secondary | ICD-10-CM | POA: Diagnosis not present

## 2020-09-13 DIAGNOSIS — W01198A Fall on same level from slipping, tripping and stumbling with subsequent striking against other object, initial encounter: Secondary | ICD-10-CM | POA: Diagnosis not present

## 2020-09-13 DIAGNOSIS — J331 Polypoid sinus degeneration: Secondary | ICD-10-CM | POA: Diagnosis not present

## 2020-09-13 DIAGNOSIS — M1712 Unilateral primary osteoarthritis, left knee: Secondary | ICD-10-CM | POA: Diagnosis not present

## 2020-09-13 DIAGNOSIS — M25562 Pain in left knee: Secondary | ICD-10-CM | POA: Diagnosis not present

## 2020-09-13 DIAGNOSIS — R600 Localized edema: Secondary | ICD-10-CM | POA: Diagnosis not present

## 2020-09-13 DIAGNOSIS — B379 Candidiasis, unspecified: Secondary | ICD-10-CM | POA: Diagnosis not present

## 2020-09-13 DIAGNOSIS — M4802 Spinal stenosis, cervical region: Secondary | ICD-10-CM | POA: Diagnosis not present

## 2020-09-13 DIAGNOSIS — I10 Essential (primary) hypertension: Secondary | ICD-10-CM | POA: Diagnosis not present

## 2020-09-13 DIAGNOSIS — Z20822 Contact with and (suspected) exposure to covid-19: Secondary | ICD-10-CM | POA: Diagnosis not present

## 2020-09-13 DIAGNOSIS — M4626 Osteomyelitis of vertebra, lumbar region: Secondary | ICD-10-CM | POA: Diagnosis not present

## 2020-09-13 DIAGNOSIS — R636 Underweight: Secondary | ICD-10-CM | POA: Diagnosis not present

## 2020-09-13 DIAGNOSIS — G319 Degenerative disease of nervous system, unspecified: Secondary | ICD-10-CM | POA: Diagnosis not present

## 2020-09-13 DIAGNOSIS — M1711 Unilateral primary osteoarthritis, right knee: Secondary | ICD-10-CM | POA: Diagnosis not present

## 2020-09-13 DIAGNOSIS — S8001XA Contusion of right knee, initial encounter: Secondary | ICD-10-CM | POA: Diagnosis not present

## 2020-09-13 DIAGNOSIS — M25551 Pain in right hip: Secondary | ICD-10-CM | POA: Diagnosis not present

## 2020-09-13 DIAGNOSIS — R2689 Other abnormalities of gait and mobility: Secondary | ICD-10-CM | POA: Diagnosis not present

## 2020-09-13 DIAGNOSIS — S8002XA Contusion of left knee, initial encounter: Secondary | ICD-10-CM | POA: Diagnosis not present

## 2020-09-13 DIAGNOSIS — J322 Chronic ethmoidal sinusitis: Secondary | ICD-10-CM | POA: Diagnosis not present

## 2020-09-13 DIAGNOSIS — Y998 Other external cause status: Secondary | ICD-10-CM | POA: Diagnosis not present

## 2020-09-13 DIAGNOSIS — Z79899 Other long term (current) drug therapy: Secondary | ICD-10-CM | POA: Diagnosis not present

## 2020-09-13 DIAGNOSIS — Z681 Body mass index (BMI) 19 or less, adult: Secondary | ICD-10-CM | POA: Diagnosis not present

## 2020-09-13 DIAGNOSIS — M4319 Spondylolisthesis, multiple sites in spine: Secondary | ICD-10-CM | POA: Diagnosis not present

## 2020-09-13 DIAGNOSIS — D6489 Other specified anemias: Secondary | ICD-10-CM | POA: Diagnosis not present

## 2020-09-13 DIAGNOSIS — M50221 Other cervical disc displacement at C4-C5 level: Secondary | ICD-10-CM | POA: Diagnosis not present

## 2020-09-13 DIAGNOSIS — R296 Repeated falls: Secondary | ICD-10-CM | POA: Diagnosis not present

## 2020-09-13 DIAGNOSIS — M25561 Pain in right knee: Secondary | ICD-10-CM | POA: Diagnosis not present

## 2020-09-13 DIAGNOSIS — M25451 Effusion, right hip: Secondary | ICD-10-CM | POA: Diagnosis not present

## 2020-09-14 ENCOUNTER — Other Ambulatory Visit: Payer: Self-pay | Admitting: Family Medicine

## 2020-09-14 DIAGNOSIS — M25561 Pain in right knee: Secondary | ICD-10-CM | POA: Diagnosis not present

## 2020-09-14 DIAGNOSIS — I10 Essential (primary) hypertension: Secondary | ICD-10-CM

## 2020-09-14 DIAGNOSIS — E871 Hypo-osmolality and hyponatremia: Secondary | ICD-10-CM | POA: Diagnosis not present

## 2020-09-14 DIAGNOSIS — R296 Repeated falls: Secondary | ICD-10-CM | POA: Diagnosis not present

## 2020-09-15 DIAGNOSIS — Z79899 Other long term (current) drug therapy: Secondary | ICD-10-CM | POA: Diagnosis not present

## 2020-09-15 DIAGNOSIS — F17210 Nicotine dependence, cigarettes, uncomplicated: Secondary | ICD-10-CM | POA: Diagnosis not present

## 2020-09-15 DIAGNOSIS — Z20822 Contact with and (suspected) exposure to covid-19: Secondary | ICD-10-CM | POA: Diagnosis not present

## 2020-09-15 DIAGNOSIS — M25551 Pain in right hip: Secondary | ICD-10-CM | POA: Diagnosis not present

## 2020-09-15 DIAGNOSIS — R296 Repeated falls: Secondary | ICD-10-CM | POA: Diagnosis not present

## 2020-09-15 DIAGNOSIS — R2689 Other abnormalities of gait and mobility: Secondary | ICD-10-CM | POA: Diagnosis not present

## 2020-09-15 DIAGNOSIS — M25562 Pain in left knee: Secondary | ICD-10-CM | POA: Diagnosis not present

## 2020-09-15 DIAGNOSIS — K5730 Diverticulosis of large intestine without perforation or abscess without bleeding: Secondary | ICD-10-CM | POA: Diagnosis not present

## 2020-09-15 DIAGNOSIS — Z9114 Patient's other noncompliance with medication regimen: Secondary | ICD-10-CM | POA: Diagnosis not present

## 2020-09-15 DIAGNOSIS — M4626 Osteomyelitis of vertebra, lumbar region: Secondary | ICD-10-CM | POA: Diagnosis not present

## 2020-09-15 DIAGNOSIS — J331 Polypoid sinus degeneration: Secondary | ICD-10-CM | POA: Diagnosis not present

## 2020-09-15 DIAGNOSIS — M25561 Pain in right knee: Secondary | ICD-10-CM | POA: Diagnosis not present

## 2020-09-15 DIAGNOSIS — R636 Underweight: Secondary | ICD-10-CM | POA: Diagnosis not present

## 2020-09-15 DIAGNOSIS — E871 Hypo-osmolality and hyponatremia: Secondary | ICD-10-CM | POA: Diagnosis not present

## 2020-09-15 DIAGNOSIS — G894 Chronic pain syndrome: Secondary | ICD-10-CM | POA: Diagnosis not present

## 2020-09-15 DIAGNOSIS — J449 Chronic obstructive pulmonary disease, unspecified: Secondary | ICD-10-CM | POA: Diagnosis not present

## 2020-09-15 DIAGNOSIS — B379 Candidiasis, unspecified: Secondary | ICD-10-CM | POA: Diagnosis not present

## 2020-09-15 DIAGNOSIS — I10 Essential (primary) hypertension: Secondary | ICD-10-CM | POA: Diagnosis not present

## 2020-09-15 DIAGNOSIS — Z681 Body mass index (BMI) 19 or less, adult: Secondary | ICD-10-CM | POA: Diagnosis not present

## 2020-09-15 DIAGNOSIS — D6489 Other specified anemias: Secondary | ICD-10-CM | POA: Diagnosis not present

## 2020-09-15 NOTE — Telephone Encounter (Signed)
2 month supply provided. Please have patient schedule a annual physical at earliest convenience as it has been almost 2 years since he was seen. Thank you.

## 2020-09-16 DIAGNOSIS — B3789 Other sites of candidiasis: Secondary | ICD-10-CM | POA: Diagnosis not present

## 2020-09-16 DIAGNOSIS — R509 Fever, unspecified: Secondary | ICD-10-CM | POA: Diagnosis not present

## 2020-09-16 DIAGNOSIS — M25561 Pain in right knee: Secondary | ICD-10-CM | POA: Diagnosis not present

## 2020-09-16 DIAGNOSIS — M4626 Osteomyelitis of vertebra, lumbar region: Secondary | ICD-10-CM | POA: Diagnosis not present

## 2020-09-16 DIAGNOSIS — J449 Chronic obstructive pulmonary disease, unspecified: Secondary | ICD-10-CM | POA: Diagnosis not present

## 2020-09-16 DIAGNOSIS — Z720 Tobacco use: Secondary | ICD-10-CM | POA: Diagnosis not present

## 2020-09-16 DIAGNOSIS — M25562 Pain in left knee: Secondary | ICD-10-CM | POA: Diagnosis not present

## 2020-09-16 DIAGNOSIS — I10 Essential (primary) hypertension: Secondary | ICD-10-CM | POA: Diagnosis not present

## 2020-09-16 DIAGNOSIS — R7989 Other specified abnormal findings of blood chemistry: Secondary | ICD-10-CM | POA: Diagnosis not present

## 2020-09-16 DIAGNOSIS — R296 Repeated falls: Secondary | ICD-10-CM | POA: Diagnosis not present

## 2020-09-16 DIAGNOSIS — E871 Hypo-osmolality and hyponatremia: Secondary | ICD-10-CM | POA: Diagnosis not present

## 2020-09-17 DIAGNOSIS — R509 Fever, unspecified: Secondary | ICD-10-CM | POA: Diagnosis not present

## 2020-09-17 DIAGNOSIS — I10 Essential (primary) hypertension: Secondary | ICD-10-CM | POA: Diagnosis not present

## 2020-09-17 DIAGNOSIS — E871 Hypo-osmolality and hyponatremia: Secondary | ICD-10-CM | POA: Diagnosis not present

## 2020-09-17 DIAGNOSIS — K5730 Diverticulosis of large intestine without perforation or abscess without bleeding: Secondary | ICD-10-CM | POA: Diagnosis not present

## 2020-09-17 DIAGNOSIS — M4626 Osteomyelitis of vertebra, lumbar region: Secondary | ICD-10-CM | POA: Diagnosis not present

## 2020-09-17 DIAGNOSIS — M25561 Pain in right knee: Secondary | ICD-10-CM | POA: Diagnosis not present

## 2020-09-17 DIAGNOSIS — M25562 Pain in left knee: Secondary | ICD-10-CM | POA: Diagnosis not present

## 2020-09-17 DIAGNOSIS — D6489 Other specified anemias: Secondary | ICD-10-CM | POA: Diagnosis not present

## 2020-09-17 DIAGNOSIS — R2689 Other abnormalities of gait and mobility: Secondary | ICD-10-CM | POA: Diagnosis not present

## 2020-09-17 DIAGNOSIS — R636 Underweight: Secondary | ICD-10-CM | POA: Diagnosis not present

## 2020-09-17 DIAGNOSIS — Z681 Body mass index (BMI) 19 or less, adult: Secondary | ICD-10-CM | POA: Diagnosis not present

## 2020-09-17 DIAGNOSIS — M25551 Pain in right hip: Secondary | ICD-10-CM | POA: Diagnosis not present

## 2020-09-17 DIAGNOSIS — Z20822 Contact with and (suspected) exposure to covid-19: Secondary | ICD-10-CM | POA: Diagnosis not present

## 2020-09-17 DIAGNOSIS — Z9114 Patient's other noncompliance with medication regimen: Secondary | ICD-10-CM | POA: Diagnosis not present

## 2020-09-17 DIAGNOSIS — B379 Candidiasis, unspecified: Secondary | ICD-10-CM | POA: Diagnosis not present

## 2020-09-17 DIAGNOSIS — R296 Repeated falls: Secondary | ICD-10-CM | POA: Diagnosis not present

## 2020-09-17 DIAGNOSIS — J331 Polypoid sinus degeneration: Secondary | ICD-10-CM | POA: Diagnosis not present

## 2020-09-17 DIAGNOSIS — M1712 Unilateral primary osteoarthritis, left knee: Secondary | ICD-10-CM | POA: Diagnosis not present

## 2020-09-17 DIAGNOSIS — F17210 Nicotine dependence, cigarettes, uncomplicated: Secondary | ICD-10-CM | POA: Diagnosis not present

## 2020-09-17 DIAGNOSIS — W19XXXA Unspecified fall, initial encounter: Secondary | ICD-10-CM | POA: Diagnosis not present

## 2020-09-17 DIAGNOSIS — J449 Chronic obstructive pulmonary disease, unspecified: Secondary | ICD-10-CM | POA: Diagnosis not present

## 2020-09-17 DIAGNOSIS — Z79899 Other long term (current) drug therapy: Secondary | ICD-10-CM | POA: Diagnosis not present

## 2020-09-17 DIAGNOSIS — G894 Chronic pain syndrome: Secondary | ICD-10-CM | POA: Diagnosis not present

## 2020-09-18 DIAGNOSIS — R509 Fever, unspecified: Secondary | ICD-10-CM | POA: Diagnosis not present

## 2020-09-18 DIAGNOSIS — B379 Candidiasis, unspecified: Secondary | ICD-10-CM | POA: Diagnosis not present

## 2020-09-18 DIAGNOSIS — R2689 Other abnormalities of gait and mobility: Secondary | ICD-10-CM | POA: Diagnosis not present

## 2020-09-18 DIAGNOSIS — Z681 Body mass index (BMI) 19 or less, adult: Secondary | ICD-10-CM | POA: Diagnosis not present

## 2020-09-18 DIAGNOSIS — W19XXXA Unspecified fall, initial encounter: Secondary | ICD-10-CM | POA: Diagnosis not present

## 2020-09-18 DIAGNOSIS — D6489 Other specified anemias: Secondary | ICD-10-CM | POA: Diagnosis not present

## 2020-09-18 DIAGNOSIS — E871 Hypo-osmolality and hyponatremia: Secondary | ICD-10-CM | POA: Diagnosis not present

## 2020-09-18 DIAGNOSIS — Z9114 Patient's other noncompliance with medication regimen: Secondary | ICD-10-CM | POA: Diagnosis not present

## 2020-09-18 DIAGNOSIS — Z20822 Contact with and (suspected) exposure to covid-19: Secondary | ICD-10-CM | POA: Diagnosis not present

## 2020-09-18 DIAGNOSIS — R296 Repeated falls: Secondary | ICD-10-CM | POA: Diagnosis not present

## 2020-09-18 DIAGNOSIS — J449 Chronic obstructive pulmonary disease, unspecified: Secondary | ICD-10-CM | POA: Diagnosis not present

## 2020-09-18 DIAGNOSIS — I10 Essential (primary) hypertension: Secondary | ICD-10-CM | POA: Diagnosis not present

## 2020-09-18 DIAGNOSIS — J331 Polypoid sinus degeneration: Secondary | ICD-10-CM | POA: Diagnosis not present

## 2020-09-18 DIAGNOSIS — Z79899 Other long term (current) drug therapy: Secondary | ICD-10-CM | POA: Diagnosis not present

## 2020-09-18 DIAGNOSIS — M1712 Unilateral primary osteoarthritis, left knee: Secondary | ICD-10-CM | POA: Diagnosis not present

## 2020-09-18 DIAGNOSIS — R636 Underweight: Secondary | ICD-10-CM | POA: Diagnosis not present

## 2020-09-18 DIAGNOSIS — F17210 Nicotine dependence, cigarettes, uncomplicated: Secondary | ICD-10-CM | POA: Diagnosis not present

## 2020-09-18 DIAGNOSIS — M25561 Pain in right knee: Secondary | ICD-10-CM | POA: Diagnosis not present

## 2020-09-18 DIAGNOSIS — G894 Chronic pain syndrome: Secondary | ICD-10-CM | POA: Diagnosis not present

## 2020-09-18 DIAGNOSIS — M25551 Pain in right hip: Secondary | ICD-10-CM | POA: Diagnosis not present

## 2020-09-18 DIAGNOSIS — M25562 Pain in left knee: Secondary | ICD-10-CM | POA: Diagnosis not present

## 2020-09-18 DIAGNOSIS — K5730 Diverticulosis of large intestine without perforation or abscess without bleeding: Secondary | ICD-10-CM | POA: Diagnosis not present

## 2020-09-18 DIAGNOSIS — M4626 Osteomyelitis of vertebra, lumbar region: Secondary | ICD-10-CM | POA: Diagnosis not present

## 2020-09-19 DIAGNOSIS — I10 Essential (primary) hypertension: Secondary | ICD-10-CM | POA: Diagnosis not present

## 2020-09-19 DIAGNOSIS — D649 Anemia, unspecified: Secondary | ICD-10-CM | POA: Diagnosis not present

## 2020-09-19 DIAGNOSIS — Z79899 Other long term (current) drug therapy: Secondary | ICD-10-CM | POA: Diagnosis not present

## 2020-09-19 DIAGNOSIS — B379 Candidiasis, unspecified: Secondary | ICD-10-CM | POA: Diagnosis not present

## 2020-09-19 DIAGNOSIS — Z743 Need for continuous supervision: Secondary | ICD-10-CM | POA: Diagnosis not present

## 2020-09-19 DIAGNOSIS — F17210 Nicotine dependence, cigarettes, uncomplicated: Secondary | ICD-10-CM | POA: Diagnosis not present

## 2020-09-19 DIAGNOSIS — Z8249 Family history of ischemic heart disease and other diseases of the circulatory system: Secondary | ICD-10-CM | POA: Diagnosis not present

## 2020-09-19 DIAGNOSIS — M21962 Unspecified acquired deformity of left lower leg: Secondary | ICD-10-CM | POA: Diagnosis not present

## 2020-09-19 DIAGNOSIS — R5081 Fever presenting with conditions classified elsewhere: Secondary | ICD-10-CM | POA: Diagnosis not present

## 2020-09-19 DIAGNOSIS — E440 Moderate protein-calorie malnutrition: Secondary | ICD-10-CM | POA: Diagnosis not present

## 2020-09-19 DIAGNOSIS — M25562 Pain in left knee: Secondary | ICD-10-CM | POA: Diagnosis not present

## 2020-09-19 DIAGNOSIS — W19XXXA Unspecified fall, initial encounter: Secondary | ICD-10-CM | POA: Diagnosis not present

## 2020-09-19 DIAGNOSIS — Z20822 Contact with and (suspected) exposure to covid-19: Secondary | ICD-10-CM | POA: Diagnosis not present

## 2020-09-19 DIAGNOSIS — R29898 Other symptoms and signs involving the musculoskeletal system: Secondary | ICD-10-CM | POA: Diagnosis not present

## 2020-09-19 DIAGNOSIS — S83242A Other tear of medial meniscus, current injury, left knee, initial encounter: Secondary | ICD-10-CM | POA: Diagnosis not present

## 2020-09-19 DIAGNOSIS — S80919A Unspecified superficial injury of unspecified knee, initial encounter: Secondary | ICD-10-CM | POA: Diagnosis not present

## 2020-09-19 DIAGNOSIS — Z9119 Patient's noncompliance with other medical treatment and regimen: Secondary | ICD-10-CM | POA: Diagnosis not present

## 2020-09-19 DIAGNOSIS — K5900 Constipation, unspecified: Secondary | ICD-10-CM | POA: Diagnosis not present

## 2020-09-19 DIAGNOSIS — R636 Underweight: Secondary | ICD-10-CM | POA: Diagnosis not present

## 2020-09-19 DIAGNOSIS — G894 Chronic pain syndrome: Secondary | ICD-10-CM | POA: Diagnosis not present

## 2020-09-19 DIAGNOSIS — M868X8 Other osteomyelitis, other site: Secondary | ICD-10-CM | POA: Diagnosis not present

## 2020-09-19 DIAGNOSIS — M6588 Other synovitis and tenosynovitis, other site: Secondary | ICD-10-CM | POA: Diagnosis not present

## 2020-09-19 DIAGNOSIS — M25569 Pain in unspecified knee: Secondary | ICD-10-CM | POA: Diagnosis not present

## 2020-09-19 DIAGNOSIS — Z7901 Long term (current) use of anticoagulants: Secondary | ICD-10-CM | POA: Diagnosis not present

## 2020-09-19 DIAGNOSIS — Z7409 Other reduced mobility: Secondary | ICD-10-CM | POA: Diagnosis not present

## 2020-09-19 DIAGNOSIS — M009 Pyogenic arthritis, unspecified: Secondary | ICD-10-CM | POA: Diagnosis not present

## 2020-09-19 DIAGNOSIS — M25551 Pain in right hip: Secondary | ICD-10-CM | POA: Diagnosis not present

## 2020-09-19 DIAGNOSIS — Z720 Tobacco use: Secondary | ICD-10-CM | POA: Diagnosis not present

## 2020-09-19 DIAGNOSIS — J449 Chronic obstructive pulmonary disease, unspecified: Secondary | ICD-10-CM | POA: Diagnosis not present

## 2020-09-19 DIAGNOSIS — R7989 Other specified abnormal findings of blood chemistry: Secondary | ICD-10-CM | POA: Diagnosis not present

## 2020-09-19 DIAGNOSIS — M65862 Other synovitis and tenosynovitis, left lower leg: Secondary | ICD-10-CM | POA: Diagnosis not present

## 2020-09-19 DIAGNOSIS — R296 Repeated falls: Secondary | ICD-10-CM | POA: Diagnosis not present

## 2020-09-19 DIAGNOSIS — B3789 Other sites of candidiasis: Secondary | ICD-10-CM | POA: Diagnosis not present

## 2020-09-19 DIAGNOSIS — M25561 Pain in right knee: Secondary | ICD-10-CM | POA: Diagnosis not present

## 2020-09-19 DIAGNOSIS — M4626 Osteomyelitis of vertebra, lumbar region: Secondary | ICD-10-CM | POA: Diagnosis not present

## 2020-09-19 DIAGNOSIS — E871 Hypo-osmolality and hyponatremia: Secondary | ICD-10-CM | POA: Diagnosis not present

## 2020-09-19 DIAGNOSIS — S83282A Other tear of lateral meniscus, current injury, left knee, initial encounter: Secondary | ICD-10-CM | POA: Diagnosis not present

## 2020-09-19 DIAGNOSIS — R2689 Other abnormalities of gait and mobility: Secondary | ICD-10-CM | POA: Diagnosis not present

## 2020-09-19 DIAGNOSIS — Z681 Body mass index (BMI) 19 or less, adult: Secondary | ICD-10-CM | POA: Diagnosis not present

## 2020-09-19 DIAGNOSIS — J331 Polypoid sinus degeneration: Secondary | ICD-10-CM | POA: Diagnosis not present

## 2020-09-19 DIAGNOSIS — W1830XA Fall on same level, unspecified, initial encounter: Secondary | ICD-10-CM | POA: Diagnosis not present

## 2020-09-19 DIAGNOSIS — D638 Anemia in other chronic diseases classified elsewhere: Secondary | ICD-10-CM | POA: Diagnosis not present

## 2020-09-19 DIAGNOSIS — R6889 Other general symptoms and signs: Secondary | ICD-10-CM | POA: Diagnosis not present

## 2020-09-19 DIAGNOSIS — D6489 Other specified anemias: Secondary | ICD-10-CM | POA: Diagnosis not present

## 2020-09-19 DIAGNOSIS — K5790 Diverticulosis of intestine, part unspecified, without perforation or abscess without bleeding: Secondary | ICD-10-CM | POA: Diagnosis not present

## 2020-09-19 DIAGNOSIS — Z9114 Patient's other noncompliance with medication regimen: Secondary | ICD-10-CM | POA: Diagnosis not present

## 2020-09-19 DIAGNOSIS — R279 Unspecified lack of coordination: Secondary | ICD-10-CM | POA: Diagnosis not present

## 2020-09-19 DIAGNOSIS — K5730 Diverticulosis of large intestine without perforation or abscess without bleeding: Secondary | ICD-10-CM | POA: Diagnosis not present

## 2020-09-19 DIAGNOSIS — M958 Other specified acquired deformities of musculoskeletal system: Secondary | ICD-10-CM | POA: Diagnosis not present

## 2020-09-19 DIAGNOSIS — R52 Pain, unspecified: Secondary | ICD-10-CM | POA: Diagnosis not present

## 2020-09-19 DIAGNOSIS — M00862 Arthritis due to other bacteria, left knee: Secondary | ICD-10-CM | POA: Diagnosis not present

## 2020-09-19 DIAGNOSIS — M868X6 Other osteomyelitis, lower leg: Secondary | ICD-10-CM | POA: Diagnosis not present

## 2020-09-20 DIAGNOSIS — I10 Essential (primary) hypertension: Secondary | ICD-10-CM | POA: Diagnosis not present

## 2020-09-20 DIAGNOSIS — M25562 Pain in left knee: Secondary | ICD-10-CM | POA: Diagnosis not present

## 2020-09-20 DIAGNOSIS — M47816 Spondylosis without myelopathy or radiculopathy, lumbar region: Secondary | ICD-10-CM | POA: Diagnosis not present

## 2020-09-20 DIAGNOSIS — S83242A Other tear of medial meniscus, current injury, left knee, initial encounter: Secondary | ICD-10-CM | POA: Diagnosis not present

## 2020-09-20 DIAGNOSIS — Z681 Body mass index (BMI) 19 or less, adult: Secondary | ICD-10-CM | POA: Diagnosis not present

## 2020-09-20 DIAGNOSIS — M868X6 Other osteomyelitis, lower leg: Secondary | ICD-10-CM | POA: Diagnosis not present

## 2020-09-20 DIAGNOSIS — M4626 Osteomyelitis of vertebra, lumbar region: Secondary | ICD-10-CM | POA: Diagnosis not present

## 2020-09-20 DIAGNOSIS — Z720 Tobacco use: Secondary | ICD-10-CM | POA: Diagnosis not present

## 2020-09-20 DIAGNOSIS — Z7409 Other reduced mobility: Secondary | ICD-10-CM | POA: Diagnosis not present

## 2020-09-20 DIAGNOSIS — M868X8 Other osteomyelitis, other site: Secondary | ICD-10-CM | POA: Diagnosis not present

## 2020-09-20 DIAGNOSIS — Z9119 Patient's noncompliance with other medical treatment and regimen: Secondary | ICD-10-CM | POA: Diagnosis not present

## 2020-09-20 DIAGNOSIS — S80919A Unspecified superficial injury of unspecified knee, initial encounter: Secondary | ICD-10-CM | POA: Diagnosis not present

## 2020-09-20 DIAGNOSIS — M00862 Arthritis due to other bacteria, left knee: Secondary | ICD-10-CM | POA: Diagnosis not present

## 2020-09-20 DIAGNOSIS — M958 Other specified acquired deformities of musculoskeletal system: Secondary | ICD-10-CM | POA: Diagnosis not present

## 2020-09-20 DIAGNOSIS — D649 Anemia, unspecified: Secondary | ICD-10-CM | POA: Diagnosis not present

## 2020-09-20 DIAGNOSIS — R52 Pain, unspecified: Secondary | ICD-10-CM | POA: Diagnosis not present

## 2020-09-20 DIAGNOSIS — Z743 Need for continuous supervision: Secondary | ICD-10-CM | POA: Diagnosis not present

## 2020-09-20 DIAGNOSIS — M009 Pyogenic arthritis, unspecified: Secondary | ICD-10-CM | POA: Diagnosis not present

## 2020-09-20 DIAGNOSIS — M4316 Spondylolisthesis, lumbar region: Secondary | ICD-10-CM | POA: Diagnosis not present

## 2020-09-20 DIAGNOSIS — M4807 Spinal stenosis, lumbosacral region: Secondary | ICD-10-CM | POA: Diagnosis not present

## 2020-09-20 DIAGNOSIS — M8668 Other chronic osteomyelitis, other site: Secondary | ICD-10-CM | POA: Diagnosis not present

## 2020-09-20 DIAGNOSIS — M6588 Other synovitis and tenosynovitis, other site: Secondary | ICD-10-CM | POA: Diagnosis not present

## 2020-09-20 DIAGNOSIS — G894 Chronic pain syndrome: Secondary | ICD-10-CM | POA: Diagnosis not present

## 2020-09-20 DIAGNOSIS — M5126 Other intervertebral disc displacement, lumbar region: Secondary | ICD-10-CM | POA: Diagnosis not present

## 2020-09-20 DIAGNOSIS — Z8249 Family history of ischemic heart disease and other diseases of the circulatory system: Secondary | ICD-10-CM | POA: Diagnosis not present

## 2020-09-20 DIAGNOSIS — B3789 Other sites of candidiasis: Secondary | ICD-10-CM | POA: Diagnosis not present

## 2020-09-20 DIAGNOSIS — M4620 Osteomyelitis of vertebra, site unspecified: Secondary | ICD-10-CM | POA: Diagnosis not present

## 2020-09-20 DIAGNOSIS — Z9114 Patient's other noncompliance with medication regimen: Secondary | ICD-10-CM | POA: Diagnosis not present

## 2020-09-20 DIAGNOSIS — M25532 Pain in left wrist: Secondary | ICD-10-CM | POA: Diagnosis not present

## 2020-09-20 DIAGNOSIS — F17210 Nicotine dependence, cigarettes, uncomplicated: Secondary | ICD-10-CM | POA: Diagnosis not present

## 2020-09-20 DIAGNOSIS — I493 Ventricular premature depolarization: Secondary | ICD-10-CM | POA: Diagnosis not present

## 2020-09-20 DIAGNOSIS — S83282A Other tear of lateral meniscus, current injury, left knee, initial encounter: Secondary | ICD-10-CM | POA: Diagnosis not present

## 2020-09-20 DIAGNOSIS — Z79899 Other long term (current) drug therapy: Secondary | ICD-10-CM | POA: Diagnosis not present

## 2020-09-20 DIAGNOSIS — R6889 Other general symptoms and signs: Secondary | ICD-10-CM | POA: Diagnosis not present

## 2020-09-20 DIAGNOSIS — G8929 Other chronic pain: Secondary | ICD-10-CM | POA: Diagnosis not present

## 2020-09-20 DIAGNOSIS — M21962 Unspecified acquired deformity of left lower leg: Secondary | ICD-10-CM | POA: Diagnosis not present

## 2020-09-20 DIAGNOSIS — J449 Chronic obstructive pulmonary disease, unspecified: Secondary | ICD-10-CM | POA: Diagnosis not present

## 2020-09-20 DIAGNOSIS — W19XXXA Unspecified fall, initial encounter: Secondary | ICD-10-CM | POA: Diagnosis not present

## 2020-09-20 DIAGNOSIS — M65862 Other synovitis and tenosynovitis, left lower leg: Secondary | ICD-10-CM | POA: Diagnosis not present

## 2020-09-20 DIAGNOSIS — E440 Moderate protein-calorie malnutrition: Secondary | ICD-10-CM | POA: Diagnosis not present

## 2020-10-08 DIAGNOSIS — Z79899 Other long term (current) drug therapy: Secondary | ICD-10-CM | POA: Diagnosis not present

## 2020-10-08 DIAGNOSIS — M545 Low back pain, unspecified: Secondary | ICD-10-CM | POA: Diagnosis not present

## 2020-10-08 DIAGNOSIS — M009 Pyogenic arthritis, unspecified: Secondary | ICD-10-CM | POA: Diagnosis not present

## 2020-10-08 DIAGNOSIS — G8929 Other chronic pain: Secondary | ICD-10-CM | POA: Diagnosis not present

## 2020-10-09 ENCOUNTER — Emergency Department (HOSPITAL_COMMUNITY)
Admission: EM | Admit: 2020-10-09 | Discharge: 2020-10-10 | Disposition: A | Discharge status: Home / Self Care | Payer: Medicare Other | Attending: Emergency Medicine | Admitting: Emergency Medicine

## 2020-10-09 DIAGNOSIS — R339 Retention of urine, unspecified: Secondary | ICD-10-CM | POA: Diagnosis not present

## 2020-10-09 DIAGNOSIS — I10 Essential (primary) hypertension: Secondary | ICD-10-CM | POA: Diagnosis not present

## 2020-10-09 DIAGNOSIS — Z7901 Long term (current) use of anticoagulants: Secondary | ICD-10-CM | POA: Diagnosis not present

## 2020-10-09 DIAGNOSIS — Z79899 Other long term (current) drug therapy: Secondary | ICD-10-CM | POA: Diagnosis not present

## 2020-10-09 DIAGNOSIS — J449 Chronic obstructive pulmonary disease, unspecified: Secondary | ICD-10-CM | POA: Insufficient documentation

## 2020-10-09 DIAGNOSIS — F17210 Nicotine dependence, cigarettes, uncomplicated: Secondary | ICD-10-CM | POA: Diagnosis not present

## 2020-10-09 DIAGNOSIS — J45909 Unspecified asthma, uncomplicated: Secondary | ICD-10-CM | POA: Diagnosis not present

## 2020-10-09 NOTE — ED Triage Notes (Signed)
Pt BIB EMS from [place], unable to urinate for roughly 12 hours, 10/10 pain in bladder for fullness. Pt has hx of requiring foley catheters and doing in-and-out caths at home. Pt admits to ETOH consumption.   A&O x4  EMS vitals: 97% RA HR 88 144/104 RR 18  Hx HTN, chronic leg pain
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
## 2020-10-10 DIAGNOSIS — R339 Retention of urine, unspecified: Secondary | ICD-10-CM | POA: Diagnosis not present

## 2020-10-10 LAB — URINALYSIS, ROUTINE W REFLEX MICROSCOPIC
Bilirubin Urine: NEGATIVE
Glucose, UA: NEGATIVE mg/dL
Hgb urine dipstick: NEGATIVE
Ketones, ur: NEGATIVE mg/dL
Leukocytes,Ua: NEGATIVE
Nitrite: NEGATIVE
Protein, ur: NEGATIVE mg/dL
Specific Gravity, Urine: 1.009 (ref 1.005–1.030)
pH: 5 (ref 5.0–8.0)

## 2020-10-10 LAB — I-STAT CHEM 8, ED
BUN: 19 mg/dL (ref 8–23)
Calcium, Ion: 1.13 mmol/L — ABNORMAL LOW (ref 1.15–1.40)
Chloride: 97 mmol/L — ABNORMAL LOW (ref 98–111)
Creatinine, Ser: 0.9 mg/dL (ref 0.61–1.24)
Glucose, Bld: 104 mg/dL — ABNORMAL HIGH (ref 70–99)
HCT: 35 % — ABNORMAL LOW (ref 39.0–52.0)
Hemoglobin: 11.9 g/dL — ABNORMAL LOW (ref 13.0–17.0)
Potassium: 3.1 mmol/L — ABNORMAL LOW (ref 3.5–5.1)
Sodium: 137 mmol/L (ref 135–145)
TCO2: 26 mmol/L (ref 22–32)

## 2020-10-10 MED ORDER — TAMSULOSIN HCL 0.4 MG PO CAPS: 0.4000 mg | ORAL_CAPSULE | Freq: Every day | ORAL | 0 refills | Status: AC

## 2020-10-10 NOTE — ED Notes (Signed)
Leg drainage bag placed on pt for d/c

## 2020-10-10 NOTE — ED Notes (Signed)
E-signature pad unavailable at time of pt discharge. This RN discussed discharge materials with pt and answered all pt questions. Pt stated understanding of discharge material. ? ?

## 2020-10-17 DIAGNOSIS — Z6820 Body mass index (BMI) 20.0-20.9, adult: Secondary | ICD-10-CM | POA: Diagnosis not present

## 2020-10-17 DIAGNOSIS — F17210 Nicotine dependence, cigarettes, uncomplicated: Secondary | ICD-10-CM | POA: Diagnosis not present

## 2020-10-17 DIAGNOSIS — G894 Chronic pain syndrome: Secondary | ICD-10-CM | POA: Diagnosis not present

## 2020-10-17 DIAGNOSIS — M1710 Unilateral primary osteoarthritis, unspecified knee: Secondary | ICD-10-CM | POA: Diagnosis not present

## 2020-10-17 DIAGNOSIS — Z79899 Other long term (current) drug therapy: Secondary | ICD-10-CM | POA: Diagnosis not present

## 2020-10-17 DIAGNOSIS — F17200 Nicotine dependence, unspecified, uncomplicated: Secondary | ICD-10-CM | POA: Diagnosis not present

## 2020-10-26 DIAGNOSIS — Z743 Need for continuous supervision: Secondary | ICD-10-CM | POA: Diagnosis not present

## 2020-10-26 DIAGNOSIS — R58 Hemorrhage, not elsewhere classified: Secondary | ICD-10-CM | POA: Diagnosis not present

## 2020-10-26 DIAGNOSIS — Y998 Other external cause status: Secondary | ICD-10-CM | POA: Diagnosis not present

## 2020-10-26 DIAGNOSIS — R6889 Other general symptoms and signs: Secondary | ICD-10-CM | POA: Diagnosis not present

## 2020-10-26 DIAGNOSIS — R109 Unspecified abdominal pain: Secondary | ICD-10-CM | POA: Diagnosis not present

## 2020-10-26 DIAGNOSIS — X58XXXA Exposure to other specified factors, initial encounter: Secondary | ICD-10-CM | POA: Diagnosis not present

## 2020-11-30 DIAGNOSIS — Z79899 Other long term (current) drug therapy: Secondary | ICD-10-CM | POA: Diagnosis not present

## 2020-11-30 DIAGNOSIS — M1710 Unilateral primary osteoarthritis, unspecified knee: Secondary | ICD-10-CM | POA: Diagnosis not present

## 2020-11-30 DIAGNOSIS — I10 Essential (primary) hypertension: Secondary | ICD-10-CM | POA: Diagnosis not present

## 2020-11-30 DIAGNOSIS — Z681 Body mass index (BMI) 19 or less, adult: Secondary | ICD-10-CM | POA: Diagnosis not present

## 2020-11-30 DIAGNOSIS — Z9181 History of falling: Secondary | ICD-10-CM | POA: Diagnosis not present

## 2020-11-30 DIAGNOSIS — G894 Chronic pain syndrome: Secondary | ICD-10-CM | POA: Diagnosis not present

## 2020-11-30 DIAGNOSIS — F17210 Nicotine dependence, cigarettes, uncomplicated: Secondary | ICD-10-CM | POA: Diagnosis not present

## 2020-11-30 DIAGNOSIS — F17200 Nicotine dependence, unspecified, uncomplicated: Secondary | ICD-10-CM | POA: Diagnosis not present

## 2020-12-28 DIAGNOSIS — N390 Urinary tract infection, site not specified: Secondary | ICD-10-CM | POA: Diagnosis not present

## 2020-12-28 DIAGNOSIS — Y998 Other external cause status: Secondary | ICD-10-CM | POA: Diagnosis not present

## 2020-12-28 DIAGNOSIS — X58XXXA Exposure to other specified factors, initial encounter: Secondary | ICD-10-CM | POA: Diagnosis not present

## 2020-12-28 DIAGNOSIS — T83511A Infection and inflammatory reaction due to indwelling urethral catheter, initial encounter: Secondary | ICD-10-CM | POA: Diagnosis not present

## 2020-12-30 DIAGNOSIS — F17210 Nicotine dependence, cigarettes, uncomplicated: Secondary | ICD-10-CM | POA: Diagnosis not present

## 2020-12-30 DIAGNOSIS — I10 Essential (primary) hypertension: Secondary | ICD-10-CM | POA: Diagnosis not present

## 2020-12-30 DIAGNOSIS — Z681 Body mass index (BMI) 19 or less, adult: Secondary | ICD-10-CM | POA: Diagnosis not present

## 2020-12-30 DIAGNOSIS — F17200 Nicotine dependence, unspecified, uncomplicated: Secondary | ICD-10-CM | POA: Diagnosis not present

## 2020-12-30 DIAGNOSIS — Z79899 Other long term (current) drug therapy: Secondary | ICD-10-CM | POA: Diagnosis not present

## 2020-12-30 DIAGNOSIS — Z9181 History of falling: Secondary | ICD-10-CM | POA: Diagnosis not present

## 2020-12-30 DIAGNOSIS — G894 Chronic pain syndrome: Secondary | ICD-10-CM | POA: Diagnosis not present

## 2020-12-30 DIAGNOSIS — R030 Elevated blood-pressure reading, without diagnosis of hypertension: Secondary | ICD-10-CM | POA: Diagnosis not present

## 2020-12-30 DIAGNOSIS — M1710 Unilateral primary osteoarthritis, unspecified knee: Secondary | ICD-10-CM | POA: Diagnosis not present

## 2021-01-18 DIAGNOSIS — Z79899 Other long term (current) drug therapy: Secondary | ICD-10-CM | POA: Diagnosis not present

## 2021-01-23 DIAGNOSIS — G894 Chronic pain syndrome: Secondary | ICD-10-CM | POA: Diagnosis not present

## 2021-01-23 DIAGNOSIS — Z79899 Other long term (current) drug therapy: Secondary | ICD-10-CM | POA: Diagnosis not present

## 2021-01-26 DIAGNOSIS — Z79899 Other long term (current) drug therapy: Secondary | ICD-10-CM | POA: Diagnosis not present

## 2021-02-13 IMAGING — MR MR LUMBAR SPINE WO/W CM
4 of 7 series · 22 of 48 positions shown · IV contrast (gadavist)
Comparison: Lumbar MRI 04/01/2012. CT Abdomen and Pelvis
03/10/2004.

CLINICAL DATA: 73-year-old male with acute exacerbation of chronic
low back pain. Fever. Pain and numbness radiating down the left leg.

EXAM:
MRI LUMBAR SPINE WITHOUT AND WITH CONTRAST
TECHNIQUE: Multiplanar and multiecho pulse sequences of the lumbar spine were
obtained without and with intravenous contrast.
CONTRAST:  6mL GADAVIST GADOBUTROL 1 MMOL/ML IV SOLN

[Series 5: T2 · sagittal · 4.0mm · 0.73mm/px · 4 of 17 slices shown (1 of 2)]
[im 1/17]
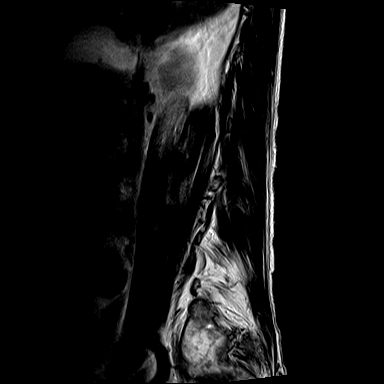
[im 6/17]
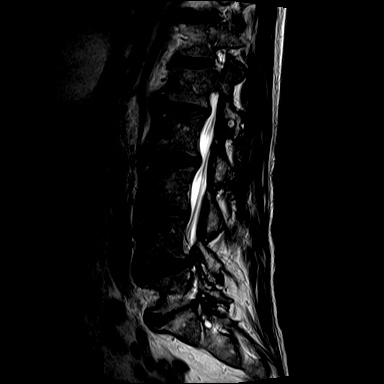
[im 11/17]
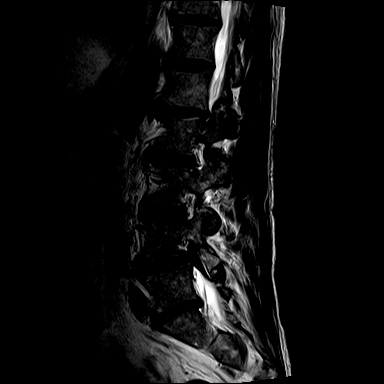
[im 17/17]
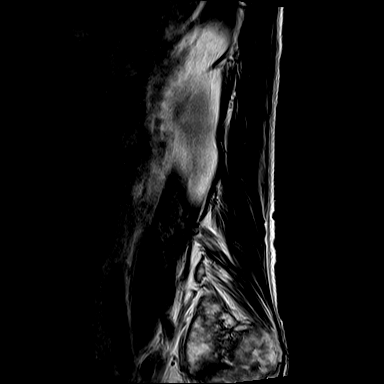

[Series 7: T1 · sagittal · 4.0mm · 0.88mm/px · 5 of 17 slices shown (1 of 2)]
[im 1/17]
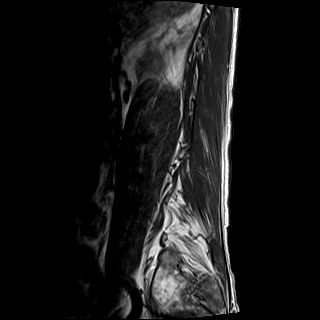
[im 5/17]
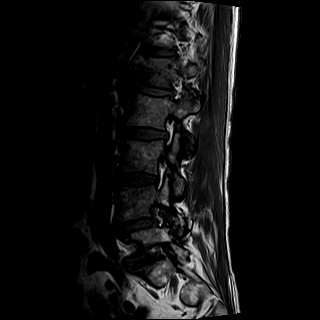
[im 9/17]
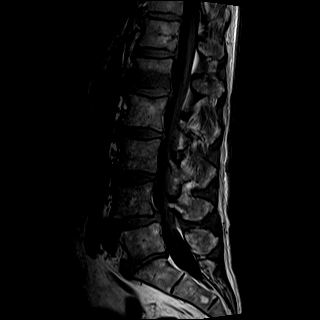
[im 13/17]
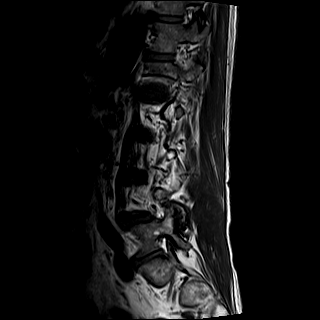
[im 17/17]
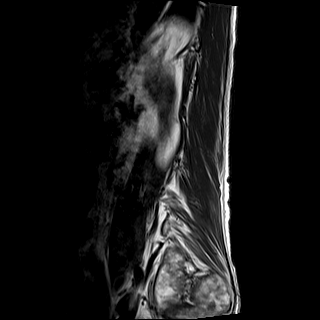

[Series 8: T2 · axial · 4.0mm · 0.57mm/px · z∈[-140,+86]mm · 8 of 36 slices shown (2 of 2)]
[im 1/36]
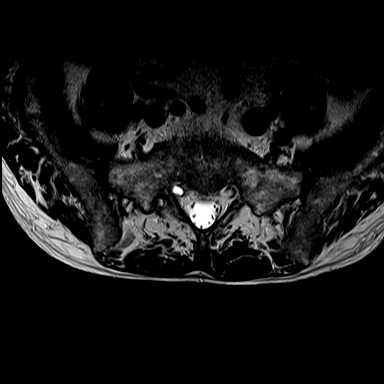
[im 4/36]
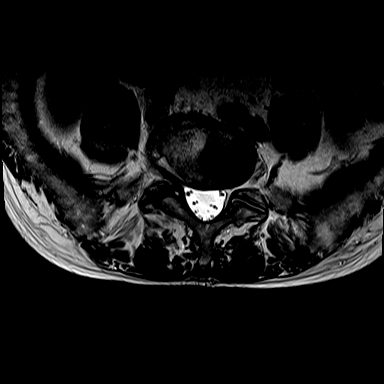
[im 12/36]
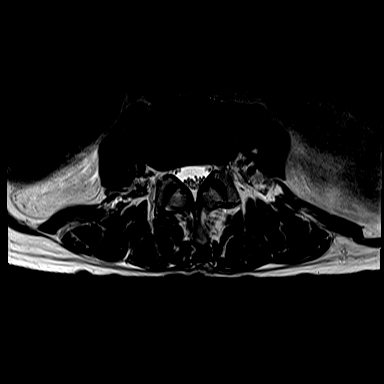
[im 16/36]
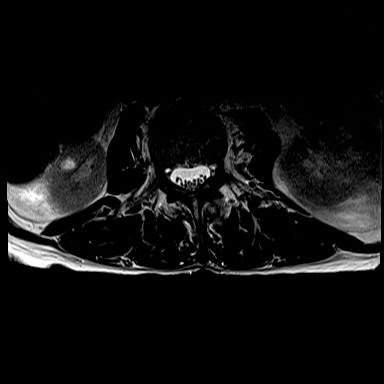
[im 20/36]
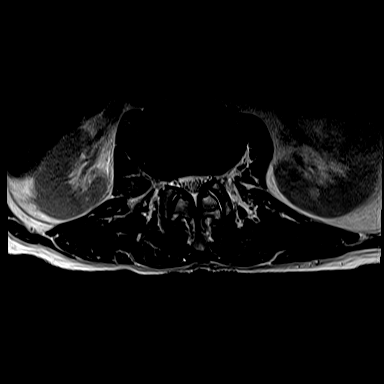
[im 24/36]
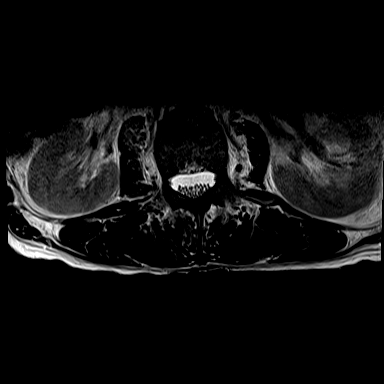
[im 32/36]
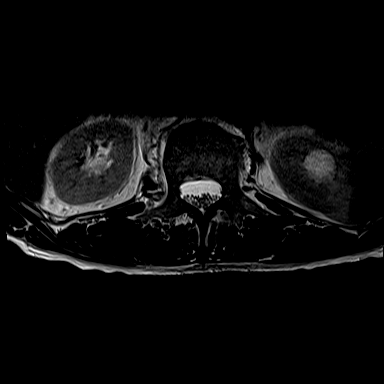
[im 36/36]
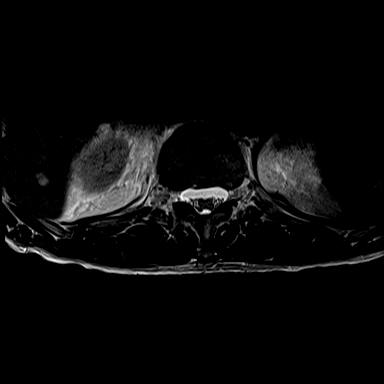

[Series 9: T1 · axial · 4.0mm · 0.34mm/px · z∈[-140,+66]mm · 5 of 36 slices shown (2 of 2)]
[im 1/36]
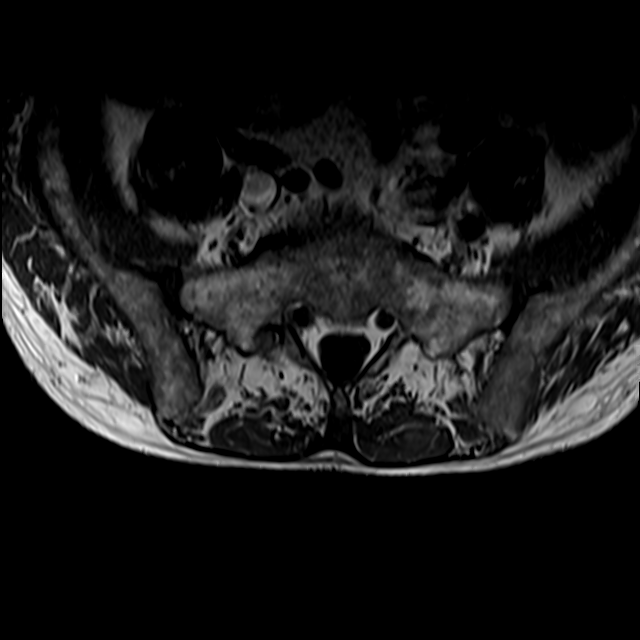
[im 4/36]
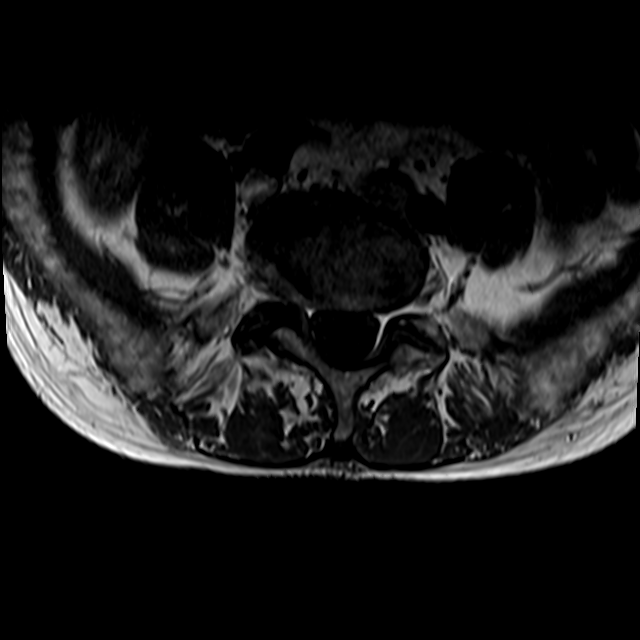
[im 12/36]
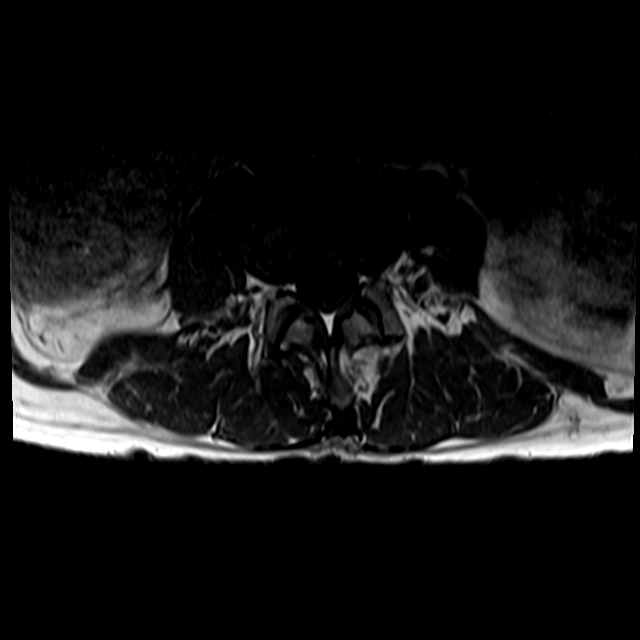
[im 20/36]
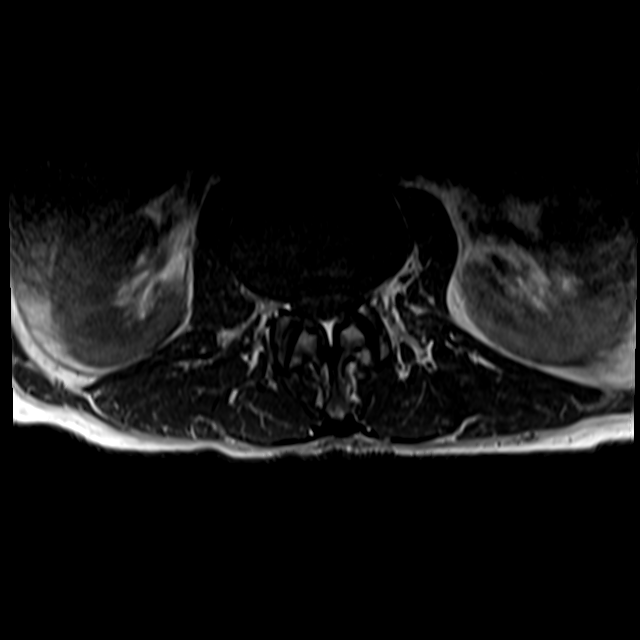
[im 32/36]
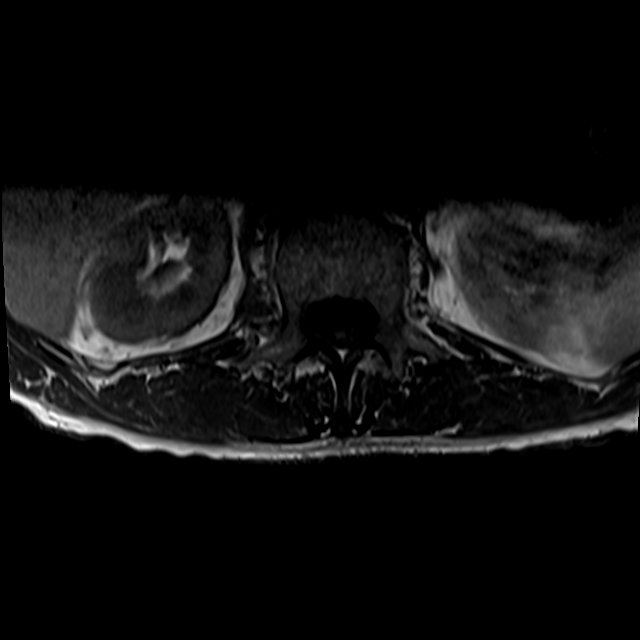

[22 of 48 positions shown; findings below may reference images not displayed]

FINDINGS: Segmentation: Normal lumbar segmentation on the 9001 comparison
which is the same numbering system used on the 6481 MRI.

Alignment: Grade 1 anterolisthesis of L4 on L5 is stable. Similar
mild retrolisthesis of L5 on S1 is stable. Superimposed dextroconvex
lumbar scoliosis appears mildly increased since 6481.

Vertebrae: Disc space loss at L1-L2 since 6481. Confluent marrow
edema and enhancement in the central and lower L1 vertebral body is
eccentric to the left, and might be associated with a new inferior
and left posterior endplate Schmorl's node (series 11, image 11 and
series 10, image 8). No abnormal increased signal or enhancement in
the L1-L2 disc, and there is only trace L2 posterosuperior endplate
edema. However, there is mild edema and enhancement in the bilateral
medial psoas muscles, a especially on the right (series 10, image
6). But no muscle or paraspinal fluid collection.

There is more typical degenerative appearing endplate edema and
enhancement at L5-S1, also new since 6481 and eccentric to the
right.

Elsewhere bone marrow signal is within normal limits. Intact visible
sacrum and SI joints.

Conus medullaris and cauda equina: Conus extends to the T12-L1
level. No lower spinal cord or conus signal abnormality. No abnormal
intradural enhancement.

Paraspinal and other soft tissues: Chronic and benign appearing
renal cysts. Negative other visible abdominal viscera. Abnormal
psoas muscles at L1-L2 as stated above but otherwise negative
paraspinal soft tissues.

Disc levels:

T11-T12: Stable mild disc bulge and facet hypertrophy. Stable mild
right T11 foraminal stenosis.

T12-L1: Subtle right paracentral disc protrusion has not
significantly changed. No stenosis.

L1-L2: Disc space loss and some disc desiccation since 6481.
Circumferential disc bulge is new. Inferior L1 endplate Schmorl's
node is new. Mild posterior element hypertrophy is not significantly
changed. No spinal stenosis. Borderline to mild bilateral lateral
recess and L1 foraminal stenosis.

L2-L3: Chronic right eccentric circumferential disc bulge and mild
posterior element hypertrophy. Mild spinal, lateral recess and
foraminal stenosis is stable.

L3-L4:  Stable mild posterior element hypertrophy.

L4-L5: Chronic anterolisthesis with disc desiccation and
circumferential disc bulge. Moderate to severe facet and ligament
flavum hypertrophy. Chronic facet joint fluid. Tiny subligamentous
synovial cysts are new (series 8, image 29) but not contributing to
stenosis.

Increased moderate spinal stenosis, severe bilateral lateral recess
stenosis (L5 nerve levels), and moderate bilateral L4 foraminal
stenosis.

L5-S1: Progressed chronic disc space loss with circumferential disc
osteophyte complex. Mild facet hypertrophy. No spinal or lateral
recess stenosis. Mild left L5 foraminal stenosis. Severe right L5
foraminal stenosis which has progressed.
IMPRESSION: 1. The symptomatic level appears to be L1 where there is confluent
edema and enhancement of the vertebral body in the setting of
progressive disc degeneration since 6481 and an inferior endplate
Schmorl's node.
However, there is also mild inflammation in the adjacent psoas
muscles.
But there is no evidence of infection in the disc at this time, and
no epidural or paraspinal fluid collection.

If the patient is febrile then early discitis osteomyelitis should
be presumed. But in the absence of fever and leukocytosis
degenerative etiology is favored.
A repeat MRI without and with contrast in 7-14 days may be the best
way to pursue definitive diagnosis.

2. Otherwise mild progression of chronic lumbar degeneration with
spondylolisthesis at L4-L5 and L5-S1 resulting in moderate spinal
stenosis at the former and severe stenosis at the level of the
bilateral L5 nerves.

## 2021-02-21 DIAGNOSIS — G8929 Other chronic pain: Secondary | ICD-10-CM | POA: Diagnosis not present

## 2021-02-21 DIAGNOSIS — Z79899 Other long term (current) drug therapy: Secondary | ICD-10-CM | POA: Diagnosis not present

## 2021-02-21 DIAGNOSIS — M545 Low back pain, unspecified: Secondary | ICD-10-CM | POA: Diagnosis not present

## 2021-02-21 DIAGNOSIS — G894 Chronic pain syndrome: Secondary | ICD-10-CM | POA: Diagnosis not present

## 2021-02-23 DIAGNOSIS — Z79899 Other long term (current) drug therapy: Secondary | ICD-10-CM | POA: Diagnosis not present

## 2021-04-14 DIAGNOSIS — G8929 Other chronic pain: Secondary | ICD-10-CM | POA: Diagnosis not present

## 2021-04-14 DIAGNOSIS — M545 Low back pain, unspecified: Secondary | ICD-10-CM | POA: Diagnosis not present

## 2021-04-14 DIAGNOSIS — M009 Pyogenic arthritis, unspecified: Secondary | ICD-10-CM | POA: Diagnosis not present

## 2021-04-14 DIAGNOSIS — Z79899 Other long term (current) drug therapy: Secondary | ICD-10-CM | POA: Diagnosis not present

## 2021-04-14 DIAGNOSIS — R030 Elevated blood-pressure reading, without diagnosis of hypertension: Secondary | ICD-10-CM | POA: Diagnosis not present

## 2021-05-04 DIAGNOSIS — Z720 Tobacco use: Secondary | ICD-10-CM | POA: Diagnosis not present

## 2021-05-04 DIAGNOSIS — M5136 Other intervertebral disc degeneration, lumbar region: Secondary | ICD-10-CM | POA: Diagnosis not present

## 2021-05-04 DIAGNOSIS — J449 Chronic obstructive pulmonary disease, unspecified: Secondary | ICD-10-CM | POA: Diagnosis not present

## 2021-05-04 DIAGNOSIS — Z79891 Long term (current) use of opiate analgesic: Secondary | ICD-10-CM | POA: Diagnosis not present

## 2021-05-04 DIAGNOSIS — I10 Essential (primary) hypertension: Secondary | ICD-10-CM | POA: Diagnosis not present

## 2021-05-04 DIAGNOSIS — M1712 Unilateral primary osteoarthritis, left knee: Secondary | ICD-10-CM | POA: Diagnosis not present

## 2021-05-25 DIAGNOSIS — G8929 Other chronic pain: Secondary | ICD-10-CM | POA: Diagnosis not present

## 2021-05-25 DIAGNOSIS — F17210 Nicotine dependence, cigarettes, uncomplicated: Secondary | ICD-10-CM | POA: Diagnosis not present

## 2021-05-25 DIAGNOSIS — M009 Pyogenic arthritis, unspecified: Secondary | ICD-10-CM | POA: Diagnosis not present

## 2021-05-25 DIAGNOSIS — M545 Low back pain, unspecified: Secondary | ICD-10-CM | POA: Diagnosis not present

## 2021-05-25 DIAGNOSIS — R030 Elevated blood-pressure reading, without diagnosis of hypertension: Secondary | ICD-10-CM | POA: Diagnosis not present

## 2021-05-25 DIAGNOSIS — F17200 Nicotine dependence, unspecified, uncomplicated: Secondary | ICD-10-CM | POA: Diagnosis not present

## 2021-05-25 DIAGNOSIS — G894 Chronic pain syndrome: Secondary | ICD-10-CM | POA: Diagnosis not present

## 2021-05-25 DIAGNOSIS — Z79899 Other long term (current) drug therapy: Secondary | ICD-10-CM | POA: Diagnosis not present

## 2022-03-20 DIAGNOSIS — I10 Essential (primary) hypertension: Secondary | ICD-10-CM | POA: Diagnosis not present

## 2022-03-20 DIAGNOSIS — Z681 Body mass index (BMI) 19 or less, adult: Secondary | ICD-10-CM | POA: Diagnosis not present

## 2022-03-20 DIAGNOSIS — R339 Retention of urine, unspecified: Secondary | ICD-10-CM | POA: Diagnosis not present

## 2022-03-20 DIAGNOSIS — Z91148 Patient's other noncompliance with medication regimen for other reason: Secondary | ICD-10-CM | POA: Diagnosis not present

## 2022-08-08 DIAGNOSIS — H269 Unspecified cataract: Secondary | ICD-10-CM | POA: Diagnosis not present

## 2022-08-08 DIAGNOSIS — I10 Essential (primary) hypertension: Secondary | ICD-10-CM | POA: Diagnosis not present

## 2022-08-08 DIAGNOSIS — Z6820 Body mass index (BMI) 20.0-20.9, adult: Secondary | ICD-10-CM | POA: Diagnosis not present

## 2022-11-12 ENCOUNTER — Ambulatory Visit: Payer: Medicare Other | Admitting: Student
